# Patient Record
Sex: Female | Born: 1935 | Race: White | Hispanic: No | Smoking: Former smoker
Health system: Southern US, Community
[De-identification: ages and names within clinical notes are randomized; demographics above are authoritative.]

## PROBLEM LIST (undated history)

## (undated) DIAGNOSIS — I509 Heart failure, unspecified: Secondary | ICD-10-CM

## (undated) DIAGNOSIS — I499 Cardiac arrhythmia, unspecified: Secondary | ICD-10-CM

## (undated) DIAGNOSIS — I10 Essential (primary) hypertension: Secondary | ICD-10-CM

## (undated) DIAGNOSIS — K219 Gastro-esophageal reflux disease without esophagitis: Secondary | ICD-10-CM

## (undated) DIAGNOSIS — D649 Anemia, unspecified: Secondary | ICD-10-CM

## (undated) DIAGNOSIS — F0390 Unspecified dementia without behavioral disturbance: Secondary | ICD-10-CM

## (undated) DIAGNOSIS — IMO0001 Reserved for inherently not codable concepts without codable children: Secondary | ICD-10-CM

## (undated) DIAGNOSIS — Z5189 Encounter for other specified aftercare: Secondary | ICD-10-CM

## (undated) HISTORY — PX: APPENDECTOMY: SHX54

## (undated) HISTORY — DX: Cardiac arrhythmia, unspecified: I49.9

## (undated) HISTORY — PX: HIP FRACTURE SURGERY: SHX118

---

## 2000-10-05 ENCOUNTER — Ambulatory Visit (HOSPITAL_COMMUNITY): Admission: RE | Admit: 2000-10-05 | Discharge: 2000-10-05 | Payer: Self-pay | Admitting: Family Medicine

## 2000-10-05 ENCOUNTER — Encounter: Payer: Self-pay | Admitting: Family Medicine

## 2001-03-15 ENCOUNTER — Encounter: Admission: RE | Admit: 2001-03-15 | Discharge: 2001-03-15 | Payer: Self-pay | Admitting: Oncology

## 2001-03-15 ENCOUNTER — Encounter (HOSPITAL_COMMUNITY): Admission: RE | Admit: 2001-03-15 | Discharge: 2001-04-14 | Payer: Self-pay | Admitting: Oncology

## 2001-04-02 ENCOUNTER — Ambulatory Visit (HOSPITAL_COMMUNITY): Admission: RE | Admit: 2001-04-02 | Discharge: 2001-04-02 | Payer: Self-pay | Admitting: Oncology

## 2001-04-02 ENCOUNTER — Encounter (HOSPITAL_COMMUNITY): Payer: Self-pay | Admitting: Oncology

## 2001-09-16 ENCOUNTER — Encounter: Admission: RE | Admit: 2001-09-16 | Discharge: 2001-09-16 | Payer: Self-pay | Admitting: Oncology

## 2001-09-16 ENCOUNTER — Encounter (HOSPITAL_COMMUNITY): Admission: RE | Admit: 2001-09-16 | Discharge: 2001-10-16 | Payer: Self-pay | Admitting: Oncology

## 2001-10-07 ENCOUNTER — Ambulatory Visit (HOSPITAL_COMMUNITY): Admission: RE | Admit: 2001-10-07 | Discharge: 2001-10-07 | Payer: Self-pay | Admitting: Family Medicine

## 2001-10-07 ENCOUNTER — Encounter: Payer: Self-pay | Admitting: Family Medicine

## 2002-04-29 ENCOUNTER — Encounter (HOSPITAL_COMMUNITY): Admission: RE | Admit: 2002-04-29 | Discharge: 2002-05-29 | Payer: Self-pay | Admitting: Family Medicine

## 2002-05-19 ENCOUNTER — Encounter (HOSPITAL_COMMUNITY): Admission: RE | Admit: 2002-05-19 | Discharge: 2002-06-18 | Payer: Self-pay | Admitting: Oncology

## 2002-05-19 ENCOUNTER — Encounter: Admission: RE | Admit: 2002-05-19 | Discharge: 2002-05-19 | Payer: Self-pay | Admitting: Oncology

## 2002-10-10 ENCOUNTER — Ambulatory Visit (HOSPITAL_COMMUNITY): Admission: RE | Admit: 2002-10-10 | Discharge: 2002-10-10 | Payer: Self-pay | Admitting: Family Medicine

## 2002-10-10 ENCOUNTER — Encounter: Payer: Self-pay | Admitting: Family Medicine

## 2002-10-20 ENCOUNTER — Ambulatory Visit (HOSPITAL_COMMUNITY): Admission: RE | Admit: 2002-10-20 | Discharge: 2002-10-20 | Payer: Self-pay | Admitting: Family Medicine

## 2002-10-20 ENCOUNTER — Encounter: Payer: Self-pay | Admitting: Family Medicine

## 2002-11-26 ENCOUNTER — Encounter (HOSPITAL_COMMUNITY): Admission: RE | Admit: 2002-11-26 | Discharge: 2002-12-25 | Payer: Self-pay | Admitting: Oncology

## 2002-11-26 ENCOUNTER — Encounter: Admission: RE | Admit: 2002-11-26 | Discharge: 2002-11-26 | Payer: Self-pay | Admitting: Oncology

## 2003-01-02 ENCOUNTER — Ambulatory Visit (HOSPITAL_COMMUNITY): Admission: RE | Admit: 2003-01-02 | Discharge: 2003-01-02 | Payer: Self-pay | Admitting: Family Medicine

## 2003-01-02 ENCOUNTER — Encounter (HOSPITAL_COMMUNITY): Admission: RE | Admit: 2003-01-02 | Discharge: 2003-02-01 | Payer: Self-pay | Admitting: Family Medicine

## 2003-01-02 ENCOUNTER — Encounter: Payer: Self-pay | Admitting: Family Medicine

## 2003-01-27 ENCOUNTER — Ambulatory Visit (HOSPITAL_COMMUNITY): Admission: RE | Admit: 2003-01-27 | Discharge: 2003-01-27 | Payer: Self-pay | Admitting: *Deleted

## 2003-02-03 ENCOUNTER — Encounter (HOSPITAL_COMMUNITY): Admission: RE | Admit: 2003-02-03 | Discharge: 2003-03-05 | Payer: Self-pay | Admitting: Family Medicine

## 2003-05-27 ENCOUNTER — Encounter (HOSPITAL_COMMUNITY): Admission: RE | Admit: 2003-05-27 | Discharge: 2003-06-26 | Payer: Self-pay | Admitting: Oncology

## 2003-05-27 ENCOUNTER — Encounter: Admission: RE | Admit: 2003-05-27 | Discharge: 2003-05-27 | Payer: Self-pay | Admitting: Oncology

## 2003-10-22 ENCOUNTER — Ambulatory Visit (HOSPITAL_COMMUNITY): Admission: RE | Admit: 2003-10-22 | Discharge: 2003-10-22 | Payer: Self-pay | Admitting: Family Medicine

## 2003-12-08 ENCOUNTER — Ambulatory Visit (HOSPITAL_COMMUNITY): Admission: RE | Admit: 2003-12-08 | Discharge: 2003-12-08 | Payer: Self-pay | Admitting: Ophthalmology

## 2003-12-29 ENCOUNTER — Encounter: Admission: RE | Admit: 2003-12-29 | Discharge: 2003-12-29 | Payer: Self-pay | Admitting: Oncology

## 2004-01-05 ENCOUNTER — Ambulatory Visit (HOSPITAL_COMMUNITY): Admission: RE | Admit: 2004-01-05 | Discharge: 2004-01-05 | Payer: Self-pay | Admitting: Ophthalmology

## 2004-02-05 ENCOUNTER — Ambulatory Visit: Payer: Self-pay | Admitting: *Deleted

## 2004-02-23 ENCOUNTER — Ambulatory Visit (HOSPITAL_COMMUNITY): Admission: RE | Admit: 2004-02-23 | Discharge: 2004-02-23 | Payer: Self-pay | Admitting: Family Medicine

## 2004-03-09 ENCOUNTER — Ambulatory Visit: Payer: Self-pay | Admitting: *Deleted

## 2004-03-24 ENCOUNTER — Ambulatory Visit: Payer: Self-pay | Admitting: *Deleted

## 2004-04-22 ENCOUNTER — Ambulatory Visit: Payer: Self-pay | Admitting: *Deleted

## 2004-04-26 ENCOUNTER — Ambulatory Visit (HOSPITAL_COMMUNITY): Admission: RE | Admit: 2004-04-26 | Discharge: 2004-04-26 | Payer: Self-pay | Admitting: General Surgery

## 2004-05-06 ENCOUNTER — Ambulatory Visit: Payer: Self-pay | Admitting: *Deleted

## 2004-06-03 ENCOUNTER — Ambulatory Visit: Payer: Self-pay | Admitting: *Deleted

## 2004-07-05 ENCOUNTER — Ambulatory Visit: Payer: Self-pay | Admitting: *Deleted

## 2004-08-02 ENCOUNTER — Ambulatory Visit: Payer: Self-pay | Admitting: Cardiology

## 2004-09-05 ENCOUNTER — Ambulatory Visit: Payer: Self-pay | Admitting: *Deleted

## 2004-10-05 ENCOUNTER — Ambulatory Visit: Payer: Self-pay | Admitting: *Deleted

## 2004-10-24 ENCOUNTER — Ambulatory Visit (HOSPITAL_COMMUNITY): Admission: RE | Admit: 2004-10-24 | Discharge: 2004-10-24 | Payer: Self-pay | Admitting: Family Medicine

## 2004-11-03 ENCOUNTER — Ambulatory Visit: Payer: Self-pay | Admitting: *Deleted

## 2004-12-05 ENCOUNTER — Ambulatory Visit: Payer: Self-pay | Admitting: *Deleted

## 2004-12-27 ENCOUNTER — Ambulatory Visit (HOSPITAL_COMMUNITY): Payer: Self-pay | Admitting: Oncology

## 2004-12-27 ENCOUNTER — Encounter (HOSPITAL_COMMUNITY): Admission: RE | Admit: 2004-12-27 | Discharge: 2005-01-26 | Payer: Self-pay | Admitting: Oncology

## 2004-12-27 ENCOUNTER — Encounter: Admission: RE | Admit: 2004-12-27 | Discharge: 2004-12-27 | Payer: Self-pay | Admitting: Oncology

## 2005-01-13 ENCOUNTER — Ambulatory Visit: Payer: Self-pay | Admitting: *Deleted

## 2005-02-20 ENCOUNTER — Ambulatory Visit: Payer: Self-pay | Admitting: *Deleted

## 2005-03-02 ENCOUNTER — Ambulatory Visit (HOSPITAL_COMMUNITY): Admission: RE | Admit: 2005-03-02 | Discharge: 2005-03-02 | Payer: Self-pay | Admitting: Family Medicine

## 2005-03-16 ENCOUNTER — Ambulatory Visit: Payer: Self-pay | Admitting: Cardiology

## 2005-04-19 ENCOUNTER — Ambulatory Visit: Payer: Self-pay | Admitting: *Deleted

## 2005-05-23 ENCOUNTER — Ambulatory Visit: Payer: Self-pay | Admitting: *Deleted

## 2005-06-07 ENCOUNTER — Ambulatory Visit: Payer: Self-pay | Admitting: Cardiology

## 2005-07-12 ENCOUNTER — Ambulatory Visit: Payer: Self-pay | Admitting: *Deleted

## 2005-08-11 ENCOUNTER — Ambulatory Visit: Payer: Self-pay | Admitting: *Deleted

## 2005-09-12 ENCOUNTER — Ambulatory Visit: Payer: Self-pay | Admitting: *Deleted

## 2005-10-23 ENCOUNTER — Ambulatory Visit: Payer: Self-pay | Admitting: *Deleted

## 2005-10-25 ENCOUNTER — Ambulatory Visit (HOSPITAL_COMMUNITY): Admission: RE | Admit: 2005-10-25 | Discharge: 2005-10-25 | Payer: Self-pay | Admitting: Family Medicine

## 2005-11-22 ENCOUNTER — Ambulatory Visit: Payer: Self-pay | Admitting: *Deleted

## 2005-12-19 ENCOUNTER — Ambulatory Visit: Payer: Self-pay | Admitting: Cardiology

## 2006-01-10 ENCOUNTER — Encounter: Admission: RE | Admit: 2006-01-10 | Discharge: 2006-01-10 | Payer: Self-pay | Admitting: Oncology

## 2006-01-10 ENCOUNTER — Encounter (HOSPITAL_COMMUNITY): Admission: RE | Admit: 2006-01-10 | Discharge: 2006-02-09 | Payer: Self-pay | Admitting: Oncology

## 2006-01-10 ENCOUNTER — Ambulatory Visit (HOSPITAL_COMMUNITY): Payer: Self-pay | Admitting: Oncology

## 2006-01-16 ENCOUNTER — Ambulatory Visit: Payer: Self-pay | Admitting: Cardiology

## 2006-02-20 ENCOUNTER — Ambulatory Visit: Payer: Self-pay | Admitting: Cardiovascular Disease

## 2006-03-22 ENCOUNTER — Ambulatory Visit: Payer: Self-pay | Admitting: Cardiology

## 2006-03-30 ENCOUNTER — Inpatient Hospital Stay (HOSPITAL_COMMUNITY): Admission: AD | Admit: 2006-03-30 | Discharge: 2006-04-07 | Payer: Self-pay | Admitting: Family Medicine

## 2006-04-03 ENCOUNTER — Ambulatory Visit: Payer: Self-pay | Admitting: Internal Medicine

## 2006-04-03 ENCOUNTER — Encounter (INDEPENDENT_AMBULATORY_CARE_PROVIDER_SITE_OTHER): Payer: Self-pay | Admitting: Specialist

## 2006-10-29 ENCOUNTER — Ambulatory Visit (HOSPITAL_COMMUNITY): Admission: RE | Admit: 2006-10-29 | Discharge: 2006-10-29 | Payer: Self-pay | Admitting: Family Medicine

## 2007-01-16 ENCOUNTER — Encounter (HOSPITAL_COMMUNITY): Admission: RE | Admit: 2007-01-16 | Discharge: 2007-02-15 | Payer: Self-pay | Admitting: Oncology

## 2007-01-16 ENCOUNTER — Ambulatory Visit (HOSPITAL_COMMUNITY): Payer: Self-pay | Admitting: Oncology

## 2007-02-12 ENCOUNTER — Emergency Department (HOSPITAL_COMMUNITY): Admission: EM | Admit: 2007-02-12 | Discharge: 2007-02-12 | Payer: Self-pay | Admitting: Emergency Medicine

## 2007-10-30 ENCOUNTER — Ambulatory Visit (HOSPITAL_COMMUNITY): Admission: RE | Admit: 2007-10-30 | Discharge: 2007-10-30 | Payer: Self-pay | Admitting: Family Medicine

## 2008-01-22 ENCOUNTER — Ambulatory Visit: Payer: Self-pay | Admitting: Vascular Surgery

## 2008-01-31 ENCOUNTER — Encounter (HOSPITAL_COMMUNITY): Admission: RE | Admit: 2008-01-31 | Discharge: 2008-03-01 | Payer: Self-pay | Admitting: Oncology

## 2008-02-04 ENCOUNTER — Ambulatory Visit (HOSPITAL_COMMUNITY): Payer: Self-pay | Admitting: Oncology

## 2008-02-26 ENCOUNTER — Ambulatory Visit: Payer: Self-pay | Admitting: Vascular Surgery

## 2008-03-11 ENCOUNTER — Ambulatory Visit: Payer: Self-pay | Admitting: Vascular Surgery

## 2008-03-18 ENCOUNTER — Ambulatory Visit: Payer: Self-pay | Admitting: Vascular Surgery

## 2008-04-23 ENCOUNTER — Ambulatory Visit (HOSPITAL_COMMUNITY): Admission: RE | Admit: 2008-04-23 | Discharge: 2008-04-23 | Payer: Self-pay | Admitting: Family Medicine

## 2008-05-08 ENCOUNTER — Ambulatory Visit: Payer: Self-pay | Admitting: Vascular Surgery

## 2008-07-31 ENCOUNTER — Ambulatory Visit (HOSPITAL_COMMUNITY): Payer: Self-pay | Admitting: Oncology

## 2008-07-31 ENCOUNTER — Encounter (HOSPITAL_COMMUNITY): Admission: RE | Admit: 2008-07-31 | Discharge: 2008-08-30 | Payer: Self-pay | Admitting: Oncology

## 2008-08-04 ENCOUNTER — Ambulatory Visit (HOSPITAL_COMMUNITY): Payer: Self-pay | Admitting: Oncology

## 2008-10-20 ENCOUNTER — Ambulatory Visit (HOSPITAL_COMMUNITY): Admission: RE | Admit: 2008-10-20 | Discharge: 2008-10-20 | Payer: Self-pay | Admitting: Ophthalmology

## 2008-11-09 ENCOUNTER — Ambulatory Visit (HOSPITAL_COMMUNITY): Admission: RE | Admit: 2008-11-09 | Discharge: 2008-11-09 | Payer: Self-pay | Admitting: Family Medicine

## 2008-11-17 ENCOUNTER — Ambulatory Visit (HOSPITAL_COMMUNITY): Admission: RE | Admit: 2008-11-17 | Discharge: 2008-11-17 | Payer: Self-pay | Admitting: Ophthalmology

## 2009-02-02 ENCOUNTER — Ambulatory Visit (HOSPITAL_COMMUNITY): Payer: Self-pay | Admitting: Oncology

## 2009-02-02 ENCOUNTER — Encounter (HOSPITAL_COMMUNITY): Admission: RE | Admit: 2009-02-02 | Discharge: 2009-03-04 | Payer: Self-pay | Admitting: Oncology

## 2009-08-04 ENCOUNTER — Ambulatory Visit (HOSPITAL_COMMUNITY): Payer: Self-pay | Admitting: Oncology

## 2009-08-04 ENCOUNTER — Encounter (HOSPITAL_COMMUNITY): Admission: RE | Admit: 2009-08-04 | Discharge: 2009-09-03 | Payer: Self-pay | Admitting: Oncology

## 2009-11-18 ENCOUNTER — Ambulatory Visit (HOSPITAL_COMMUNITY): Admission: RE | Admit: 2009-11-18 | Discharge: 2009-11-18 | Payer: Self-pay | Admitting: Family Medicine

## 2010-02-15 ENCOUNTER — Ambulatory Visit (HOSPITAL_COMMUNITY): Payer: Self-pay | Admitting: Oncology

## 2010-02-15 ENCOUNTER — Encounter (HOSPITAL_COMMUNITY)
Admission: RE | Admit: 2010-02-15 | Discharge: 2010-03-17 | Payer: Self-pay | Source: Home / Self Care | Attending: Oncology | Admitting: Oncology

## 2010-04-17 ENCOUNTER — Encounter: Payer: Self-pay | Admitting: Family Medicine

## 2010-06-07 LAB — CBC
HCT: 38.3 % (ref 36.0–46.0)
Hemoglobin: 12.1 g/dL (ref 12.0–15.0)
MCH: 29.7 pg (ref 26.0–34.0)
MCHC: 31.6 g/dL (ref 30.0–36.0)
MCV: 94.1 fL (ref 78.0–100.0)
Platelets: 73 K/uL — ABNORMAL LOW (ref 150–400)
RBC: 4.07 MIL/uL (ref 3.87–5.11)
RDW: 16.1 % — ABNORMAL HIGH (ref 11.5–15.5)
WBC: 6.2 K/uL (ref 4.0–10.5)

## 2010-06-14 LAB — CBC
MCHC: 33.5 g/dL (ref 30.0–36.0)
RBC: 3.73 MIL/uL — ABNORMAL LOW (ref 3.87–5.11)

## 2010-06-16 ENCOUNTER — Ambulatory Visit (HOSPITAL_COMMUNITY)
Admission: RE | Admit: 2010-06-16 | Discharge: 2010-06-16 | Disposition: A | Discharge status: Home / Self Care | Payer: Medicare Other | Source: Ambulatory Visit | Attending: Family Medicine | Admitting: Family Medicine

## 2010-06-16 ENCOUNTER — Other Ambulatory Visit (HOSPITAL_COMMUNITY): Payer: Self-pay | Admitting: Family Medicine

## 2010-06-16 DIAGNOSIS — R0600 Dyspnea, unspecified: Secondary | ICD-10-CM

## 2010-06-16 DIAGNOSIS — R0609 Other forms of dyspnea: Secondary | ICD-10-CM | POA: Insufficient documentation

## 2010-06-16 DIAGNOSIS — R0989 Other specified symptoms and signs involving the circulatory and respiratory systems: Secondary | ICD-10-CM | POA: Insufficient documentation

## 2010-06-29 LAB — IRON AND TIBC
Saturation Ratios: 20 % (ref 20–55)
UIBC: 250 ug/dL

## 2010-06-29 LAB — FERRITIN: Ferritin: 162 ng/mL (ref 10–291)

## 2010-06-29 LAB — CBC: RBC: 3.82 MIL/uL — ABNORMAL LOW (ref 3.87–5.11)

## 2010-07-05 LAB — CBC
MCHC: 33.6 g/dL (ref 30.0–36.0)
WBC: 5 10*3/uL (ref 4.0–10.5)

## 2010-08-09 NOTE — Procedures (Signed)
DUPLEX DEEP VENOUS EXAM - LOWER EXTREMITY   INDICATION:  Followup right great saphenous vein ablation.   HISTORY:  Edema:  No.  Trauma/Surgery:  No.  Pain:  No.  PE:  No.  Previous DVT:  Right great saphenous thrombosis of the mid to distal  thigh noted on the exam performed at [facility] on 09/24/2007.  Anticoagulants:  Other:   DUPLEX EXAM:                CFV   SFV   PopV  PTV    GSV                R  L  R  L  R  L  R   L  R  L  Thrombosis    0  0  0     0     0      +  Spontaneous   +  +  +     +     +      0  Phasic        +  +  +     +     +      0  Augmentation  +  +  +     +     +      0  Compressible  +  +  +     +     +      0  Competent     0  0  0     0     +      0   Legend:  + - yes  o - no  p - partial  D - decreased    IMPRESSION:  1. Duplex shows no evidence of deep venous thrombosis.  2. The right great saphenous vein is ablated from proximal thigh to      mid distal thigh.  Reflux noted in deep venous system.        _____________________________  Larina Earthly, M.D.   AC/MEDQ  D:  03/18/2008  T:  03/18/2008  Job:  [id number]

## 2010-08-09 NOTE — Assessment & Plan Note (Signed)
OFFICE VISIT   Danielle Walker, Danielle Walker  DOB:  [birth year]                                       02/26/2008  JYNWG#:[id number]   The patient presents today for continued evaluation of venous  hypertension.  She is a very pleasant 77 year old white female with a  greater than 20 year history of venous ulceration.  She had seen Dr.  Darrick Penna for initial evaluation on 01/22/2008 and I have seen her for  further discussion for possible ablation of her superficial venous  hypertension.  She has had a many year history of bilateral venous  ulcers and has had treatment at [facility] and also at [facility] and at  [place].  She currently has an ulceration over her left anterior  lateral ankle and right posterior to her medial malleolus.  These are  quite shallow and are nearly healed.  She has had aggressive treatment  at the wound center at [facility] since May of this year.  Her daughter is  with her and reports that these have been larger in the past and are  making improvement.  I reviewed her venous Doppler study with the  patient and her daughter.  This does show superficial reflux and also  shows reflux in her superficial septum from the junction bilaterally and  also does show reflux in her bilateral femoral and popliteal segments.   On duplex imaging today with handheld Doppler.  She does have enlarged  right great saphenous vein from her saphenofemoral junction to mid thigh  with multiple tributary feeding vessels coming off of this.  On the left  side this is much less pronounced with a normal to small sized great  saphenous vein.  She does have some dilatation of this with collaterals  in the mid portion of her distal thigh and proximal calf.  I had a long  discussion with the patient and her daughter present.  I explained that  due to her deep venous reflux that this would be not possible to  completely treat her venous hypertension.  I did report that on the  right  side with her significantly enlarged saphenous vein I feel that we  would be able to make an impact on her venous hypertension with ablation  of her right great saphenous vein from her saphenofemoral junction to  mid thigh.  I explained that this is an outpatient procedure under local  anesthesia taking approximately 1 hour.  She understands this and wished  to proceed once we have assured insurance coverage with her.  I would  not recommend ablation of her left saphenous vein since this does not  appear to be as significant as the right side.  If she has dramatic  improvement on the right we would consider ablation of the mid thigh  portion of her left great saphenous vein which is the largest area with  reflux.  She will continue her local care at [facility] and we  will proceed with right leg ablation at her convenience.   Larina Earthly, M.D.  Electronically Signed   TFE/MEDQ  D:  02/26/2008  T:  02/27/2008  Job:  [id number]

## 2010-08-09 NOTE — Assessment & Plan Note (Signed)
OFFICE VISIT   VENDA, DICE V  DOB:  21-Nov-1923                                       05/08/2008  [id number]#:[id number]   Tana Trefry presents today 6 weeks following laser ablation of her  right great saphenous vein.  She had no complications related to this.  She does have a long history of bilateral venous hypertension and venous  ulcer disease.  She did have a left lateral ankle ulcer as well and this  is being treated with weekly Unna boots.  She has had a difficult time  wearing her compression garment on the right leg.  This continues to  roll down and cause pain for her.  She does have induration and  superficial excoriation over her right lateral ankle and medial ankle  between the malleolus and the Achilles tendon.  I re-imaged her right  saphenous vein and this is successfully closed.  She does have deep  venous reflux as well.  I discussed this at length with Ms. Cranmore and  her family present.  She does have reflux in her left great saphenous  vein.  I explained that she would be a candidate for left leg great  saphenous vein ablation, but I am not sure that this is warranted.  I  explained that with her deep reflux and recurrent ulceration on the  right leg, I am not sure that it would be enough of a contribution to  treat her superficial reflux since she is having ongoing problems with  venous hypertension on the right leg despite superficial correction.  We  have placed her in bilateral Unna boots today.  She will continue to  follow up with Dr. Mills Koller at the [facility].  I  feel that she will require chronic Unna boot treatment for chronic  venous stasis disease and, again, expressed the critical importance of  compression once her wounds are fully healed.  She will see [place] again on  an as-needed basis.   Larina Earthly, M.D.  Electronically Signed   TFE/MEDQ  D:  05/08/2008  T:  05/11/2008  Job:  [id number]   cc:    Jake Shark A. Tanda Rockers, M.D.

## 2010-08-09 NOTE — Assessment & Plan Note (Signed)
OFFICE VISIT   Danielle Walker, Danielle Walker  DOB:  1923/05/12                                       01/22/2008  EAVWU#:[id number]   The patient is an 77 year old female referred by Dr. Burman Freestone for chronic  leg ulcerations.  She is currently being followed in the wound care  clinic at [facility].   She has a lengthy history of ulcerations in both lower extremities.  These have been present for approximately 20 years off and on.  She has  had compression therapy in the past.  She has had multiple skin grafts  in the past.  The ulcerations usually occur at the same place on both  ankles.  Her daughter states that the wounds have never healed  completely or for any lengthy period of time in the last 20 years.  She  did have sclerotherapy of some varicose veins greater than 20 years ago.  This was primarily done for cosmetic reasons.   PAST MEDICAL HISTORY:  Her past medical history is fairly unremarkable.  She apparently has an irregular heart beat but her daughter did not know  whether or not she had atrial fibrillation.   MEDICATIONS:  1. Folate 1 mg once a day.  2. Evista 60 mg once a day.  3. Digoxin 0.125 mg once a day.  4. Ranitidine 150 mg twice a day.  5. Trental 400 mg four times a day.  6. Calcium 500 mg once a day.   ALLERGIES:  She is allergic to penicillin which causes a rash.   PAST SURGICAL HISTORY:  She had an appendectomy.   She denies history of diabetes.   FAMILY HISTORY:  Unremarkable.   SOCIAL HISTORY:  She is married.  She is a nonsmoker.  She quit 20 years  ago and only occasionally smoked prior to that.  She does not consume  alcohol regularly.   REVIEW OF SYSTEMS:  GI:  She has a hiatal hernia.  GU:  She has urinary frequency.  VASCULAR:  She denies history of TIA or stroke.  Neurologic, orthopedic, psychiatric, ENT, hematologic, pulmonary,  cardiac review of systems are otherwise negative.   PHYSICAL EXAMINATION:  Vital signs:  On  physical exam blood pressure is  160/85 in the left arm, pulse is 64 regular.  HEENT:  Unremarkable.  Neck:  Has 2+ carotid pulses without bruit.  Chest:  Clear to  auscultation.  Cardiac:  Slightly irregular without murmur.  Abdomen:  Is soft, nontender, nondistended with no masses.  Extremities:  She has  2+ femoral, popliteal and dorsalis pedis pulses bilaterally.  Posterior  tibial pulses are not palpable but she has very tight skin from scarring  from previous ulcerations in this area.  She has a 3 x 2 cm ulceration  on the right medial malleolus.  She has a 3.3 x 1 cm left lateral  malleolus ulcer.  She has no surrounding erythema.  She has no  significant edema in the lower extremities.   She had a venous duplex exam today which showed no evidence of DVT.  She  had chronic partially occlusive thrombus in the right greater saphenous  vein.  She had bilateral reflux at the saphenofemoral junction.  She  also had evidence of deep venous incompetence.  Previous ABIs performed  at [facility] on 09/26/2007 were 1.1 on the right and  1.29 on the left.   In summary, the patient has had a lengthy history of exacerbations and  remissions of lower extremity ulcers greater than 20 years.  I agree  with Dr. Burman Freestone that most likely these are venous in nature.  She does  have evidence of saphenofemoral junction reflux in both lower  extremities.  However, she also has evidence of deep venous  incompetence.  I believe she might have some benefit from laser ablation  of her greater saphenous vein for improvement of her lower extremity  venous symptoms.  However, I did counsel her daughter and the patient  today that since she has had a 20 year history of this that probably she  will continue to have some exacerbations and remissions and still would  require compression therapy long-term.  In light of her saphenous vein  reflux I have scheduled her for an appointment with Dr. Arbie Cookey, one of my   partners, to evaluate her for possible laser ablation of her saphenous  vein.  This is scheduled for early December.  She will continue to be  followed at the wound clinic for compression therapy and treatment of  her ulcerations in the meantime.   Janetta Hora. Fields, MD  Electronically Signed   CEF/MEDQ  D:  01/23/2008  T:  01/23/2008  Job:  [id number]   cc:   Kirk Ruths, M.D.  Dr Yolanda Bonine

## 2010-08-09 NOTE — Procedures (Signed)
DUPLEX DEEP VENOUS EXAM - LOWER EXTREMITY   INDICATION:  Nonhealing ulcers of bilateral lower extremities.   HISTORY:  Edema:  Bilateral.  Trauma/Surgery:  No.  Pain:  No.  PE:  No.  Previous DVT:  Right greater saphenous thrombosis of the mid-to-distal  thigh noted on the exam performed at [facility] on 09/24/2007.  Anticoagulants:  Other:   DUPLEX EXAM:                CFV   SFV   PopV  PTV    GSV                R  L  R  L  R  L  R   L  R  L  Thrombosis    o  o  o  o  o  o         +  0  Spontaneous   +  +  +  +  +  +         D  +  Phasic        +  +  +  +  +  +         D  +  Augmentation  +  +  +  +  +  +         D  +  Compressible  +  +  +  +  +  +         P  +  Competent     0  0  0  0  0  0         0   Legend:  + - yes  o - no  p - partial  D - decreased   IMPRESSION:  1. No evidence of deep venous thrombosis noted in the bilateral lower      extremities with chronic partially occlusive thrombus noted in the      right greater saphenous vein in the mid-to-distal thigh level.  2. Reflux is noted throughout the bilateral femoral-popliteal system      and at the bilateral saphenofemoral junction levels.  3. Unable to examine the bilateral calf veins due to bandaging.  4. Incidental finding of enlarged lymph nodes noted in the bilateral      groin regions.    _____________________________  Janetta Hora Fields, MD   CH/MEDQ  D:  01/22/2008  T:  01/22/2008  Job:  [id number]

## 2010-08-09 NOTE — Assessment & Plan Note (Signed)
OFFICE VISIT   ICIS, BUDREAU V  DOB:  1923/09/13                                       03/18/2008  [id number]#:[id number]   The patient presents today for 1 week followup right leg laser ablation  of her great saphenous vein with stab phlebectomy and tributary  varicosities.  She did well with the procedure and had minimal  discomfort associated with this.  She underwent venous duplex today in  our office which reveals closure of her saphenous vein throughout its  course from the knee to the saphenofemoral junction and no evidence of  deep vein thrombosis or injury.  I am quite pleased with her initial  result as is the patient.  She does have an ulceration on the left  pretibial area, which is being treated with Unna boot therapy weekly.  Her saphenous vein on the left is of smaller than normal size with mild  reflux and I feel that compression is the appropriate treatment.  She  will have an Unna boot placed on the left leg today and will continue to  follow up with the [facility].  She will see me again in 6  weeks for final followup regarding her right leg ablation.   Larina Earthly, M.D.  Electronically Signed   TFE/MEDQ  D:  03/18/2008  T:  03/19/2008  Job:  [id number]   cc:   [facility]

## 2010-08-12 NOTE — Procedures (Signed)
   NAME:  Danielle Walker, Danielle Walker                        ACCOUNT NO.:  [id number]   MEDICAL RECORD NO.:  [id number]                   PATIENT TYPE:  OUT   LOCATION:  RAD                                  FACILITY:  [facility]   PHYSICIAN:  Amity Bing, M.D.               DATE OF BIRTH:  16-Jul-1923   DATE OF PROCEDURE:  01/27/2003  DATE OF DISCHARGE:                                  ECHOCARDIOGRAM   CLINICAL DATA:  77 year-old woman with hypertension and atrial  fibrillation.   M-MODE:  Aorta 2.8, left atrium 5.0, septum 1.6, posterior wall 1.1, LV  diastole 3.9, LV systole 2.9.   1. Technically adequate echocardiographic study.  2. Mild left atrial enlargement; right atrial size at the upper limit of     normal.  Normal right ventricular size and function.  3. Normal mitral valve; mild annular calcification; trivial regurgitation.  4. Normal tricuspid and pulmonic valve; trivial tricuspid regurgitation;     mild elevation in estimated RV systolic pressure.  5. Mild aortic valvular sclerosis; mild annular calcification.  6. Normal internal dimension of the left ventricle; mild hypertrophy with     disproportion and involvement of the upper septum.  Normal regional and     global LV systolic function.  7. Normal IVC.      ___________________________________________                                            Conway Bing, M.D.   RR/MEDQ  D:  01/27/2003  T:  01/27/2003  Job:  [id number]

## 2010-08-12 NOTE — Assessment & Plan Note (Signed)
[facility]                         [place] [facility] OFFICE NOTE   NAME:Osso, LYZETTE REINHARDT                     MRN:          [id number]  DATE:11/22/2005                            DOB:          Sep 09, 1923    Ms Sidell is a lady we follow who has paroxysmal atrial fibrillation, on  Coumadin therapy.  She has done very well.  She also has mild hypertension  which is well controlled.   She is on the following medications:  1. Lanoxin 0.125 mg once a day.  2. Coumadin as directed by the [facility].  3. Evista 60 mg a day.  4. Zantac 150 mg a day.  5. Folic acid 1 mg a day.  6. Hydrochlorothiazide 12.5 mg once a day.   PHYSICAL EXAMINATION:  VITAL SIGNS:  She is 183 pounds, which is stable from  previous.  Her blood pressure is 130/70, pulse is 74.  CHEST:  Clear.  She is mildly kyphotic, so I do not hear any significant  sounds at the bases.  CARDIOVASCULAR:  Exam is somewhat distant but regular, no obvious murmur  noted.  EXTREMITIES:  Lower extremities she has some orthopedic challenges, but no  significant edema.   Overall Ms Pedraza is doing fine.  Her heart rate is reasonably well  controlled just on a simple regimen of Digoxin with the Coumadin, and I  think that in the grand scheme of things she is doing very well.  We will  see her back in 6 months for routine followup.                                   Farris Has. Dorethea Clan, MD   JMH/MedQ  DD:  11/22/2005  DT:  11/23/2005  Job #:  [id number]   cc:   Kirk Ruths, MD

## 2010-08-12 NOTE — Op Note (Signed)
NAME:  Danielle Walker, Danielle Walker              ACCOUNT NO.:  [id number]   MEDICAL RECORD NO.:  [id number]          PATIENT TYPE:  INP   LOCATION:  A212                          FACILITY:  [facility]   PHYSICIAN:  Lionel December, M.D.    DATE OF BIRTH:  [birth year]   DATE OF PROCEDURE:  04/03/2006  DATE OF DISCHARGE:                               OPERATIVE REPORT   PROCEDURE:  Colonoscopy followed by esophagogastroduodenoscopy.   INDICATION:  Danielle Walker is an 77 year old Caucasian female with history  of peripheral vascular disease and idiopathic thrombocytopenia (platelet  count greater than 100,000), who presents with acute GI bleed and  anemia.  She has received 2 units of PRBCs.  Her INR has corrected down  to 1.9 this morning.  Since then, she has received for another mg of  vitamin K. She is undergoing diagnostic colonoscopy followed by  esophagogastroduodenoscopy if indicated.  The procedure risks were  reviewed with the patient and informed consent was obtained.   MEDS FOR CONSCIOUS SEDATION:  Demerol 25 mg IV, Versed 3 mg IV in  divided dose.  Benzocaine spray for oropharyngeal topical anesthesia.   FINDINGS:  The procedure was performed in the endoscopy suite.  The  patient's vital signs and O2 sat were monitored during the procedure and  remained stable.   DESCRIPTION OF PROCEDURE:  1. Colonoscopy.  A rectal examination was performed.  No abnormality      was noted on external or digital exam.  The Pentax videoscope was      placed in the rectum and advanced under vision into the sigmoid      colon and beyond.  She had burgundy blood in dependent segments      with a few clots.  There were a few diverticula at the sigmoid      colon but none of these were bleeding or had clot.  The scope was      passed into cecum which was identified by the appendiceal stump and      ileocecal valve.  There was some burgundy blood in this area but,      once again, no bleeding site or AV malformations or  masses were      noted.  A short segment of the TI was also examined and there was      just a dilute coating of burgundy blood, once again, mucosa was      normal.  As the scope was withdrawn, the colonic mucosa was once      again carefully examined and no bleeding lesion was noted.  There      was a 4 mm polyp at the distal sigmoid colon which was ablated via      cold biopsy.  The rectal mucosa was normal.  The scope was      retroflexed to examine the anorectal junction which was      unremarkable.  The endoscope was straightened and the patient      prepared for procedure 2.   1. Esophagogastroduodenoscopy.  The Pentax videoscope was passed via      oropharynx  without any difficulty into esophagus.  Esophagus:  The mucosa of the esophagus was normal.  The GE junction was  at 40 cm from the incisors and was unremarkable.  Stomach.  It had some bile and left over GoLYTELY prep, but there was no  evidence of bleeding.  The stomach distended very well with  insufflation.  The folds of the proximal stomach were normal.  Examination of the mucosa at body, antrum, pyloric channel as well as  angularis, fundus and cardia was normal.  Duodenum:  The bulbar mucosa was normal.  The scope was passed into the  second and third part of the duodenum where mucosa and folds were  normal.  The endoscope was withdrawn.  The patient tolerated the  procedure well.   FINAL DIAGNOSIS:  1. [place] blood and few small clots noted scattered throughout the      colon but no bleeding lesion identified.  2. A few small diverticula at sigmoid colon.  3. 4 mL polyp ablated via cold biopsy from sigmoid colon.  4. Normal esophagogastroduodenoscopy.   RECOMMENDATIONS:  Will proceed with Given capsule study.  I reviewed the  procedure risks with the patient, the patient's husband, and their  daughter, informed consent was obtained.  She will be given single dose  of Reglan IV to facilitate passage of capsule  through upper GI tract.  She will be typed and crossmatched for 2 units.  She will get another  H&H and INR this afternoon.      Lionel December, M.D.  Electronically Signed     NR/MEDQ  D:  04/03/2006  T:  04/03/2006  Job:  [id number]

## 2010-08-12 NOTE — H&P (Signed)
NAME:  Danielle Walker, Danielle Walker              ACCOUNT NO.:  [id number]   MEDICAL RECORD NO.:  [id number]          PATIENT TYPE:  AMB   LOCATION:  A212                          FACILITY:  [facility]   PHYSICIAN:  Kirk Ruths, M.D.DATE OF BIRTH:  [birth year]   DATE OF ADMISSION:  DATE OF DISCHARGE:  LH                              HISTORY & PHYSICAL   CHIEF COMPLAINT:  Fatigue.   HISTORY OF PRESENT ILLNESS:  This 77 year old female was seen by Dr.  Malvin Johns on the day before admission feeling tired. Subsequent labs  showed a hemoglobin of 5. The patient was referred to me for admission.  She states she has been having bright red blood in her stools for  approximately a month. The patient denies abdominal pain or melena. She  is admitted for transfusion and GI evaluation.   ALLERGIES:  1. SULFA.  2. PENICILLIN.  3. VIBRAMYCIN.  4. TEQUIN.  5. CEFADROXIL.  6. MYCIN DRUGS.   PAST MEDICAL HISTORY:  1. She has a history of atrial fibrillation for which she takes 5 mg      of Coumadin a day.  2. The patient also has chronic ITP for which she sees Dr. __________      .  3. Peripheral vascular disease.  4. Chronic leg ulcerations followed by Dr. Malvin Johns.  5. Osteoporosis.  6. Status post appendectomy.  7. Status post right hip fracture.   MEDICATIONS:  1. Coumadin 5 mg a day.  2. Zantac 150 mg twice a day for a remote history of peptic ulcer      disease.  3. Evista 60 mg weekly.  4. Folic acid 1 mg daily.  5. Iron.  6. Lanoxin 125 mcg daily.  7. Calcium 500 mg twice a day.   REVIEW OF SYSTEMS:  Denies chest pain, nausea, vomiting or diarrhea. She  does admit to significant dyspnea on exertion of late and some mild  swelling in her lower extremities.   PHYSICAL EXAMINATION:  GENERAL: Elderly female who appears pale.  VITAL SIGNS: She is afebrile. Pulse 60 and irregular, blood pressure  110/60, respirations 20 and unlabored.  HEENT: TMs are normal. Pupils are equal to light and  accommodation.  Oropharynx is benign.  NECK: Supple without JVD, bruits or thyromegaly.  LUNGS: Clear.  HEART: Regular sinus rhythm without murmur, gallop or rub.  EXTREMITIES: Without clubbing or cyanosis. There is 1+ edema.  NEUROLOGIC: Grossly intact.   ASSESSMENT:  Anemia probably secondary to GI bleed: Check her pro-times.  Will transfuse and get a GI consult.      Kirk Ruths, M.D.  Electronically Signed     WMM/MEDQ  D:  03/30/2006  T:  03/30/2006  Job:  [id number]

## 2010-08-12 NOTE — Op Note (Signed)
NAME:  Danielle Walker, Danielle Walker              ACCOUNT NO.:  [id number]   MEDICAL RECORD NO.:  [id number]          PATIENT TYPE:  INP   LOCATION:  A332                          FACILITY:  [facility]   PHYSICIAN:  Lionel December, M.D.    DATE OF BIRTH:  [birth year]   DATE OF PROCEDURE:  04/03/2006  DATE OF DISCHARGE:  04/07/2006                               OPERATIVE REPORT   PROCEDURE:  Small bowel given capsule study.   INDICATION:  Danielle Walker is a 77 year old Caucasian female who presented  with acute GI bleed and anemia.  Her INR is corrected to almost normal.  She had EGD and colonoscopy earlier in the day.  No significant findings  noted on EGD but she had few diverticula sigmoid colon but dark blood  scattered throughout the colon without any obvious lesion.  She is  therefore undergoing this study.   The patient swallowed given capsule without any difficulty.   FINDINGS:  Capsule made it to the stomach in 1 minute and 46 seconds and  into duodenum at 1 hour and 10 minutes.   Study period was 8 hours but capsule was in distal small bowel but never  made it to the cecum.   There is a focal antral erythema without bleeding.  There was focal  erythema in distal small bowel seen at hour 6, 2 minutes and 43 seconds  but without stigmata of bleed.   FINAL DIAGNOSIS:  Given capsule study is incomplete as capsule did not  make it to the cecum.   Focal antral erythema and focal erythema noted at distal small bowel  without stigmata of bleeding.   These findings were reviewed with the patient and Dr. Regino Schultze on the  morning of 04/04/2006.   RECOMMENDATIONS:  Resume Coumadin 1-2 weeks.   Should she experience another episode of bleeding, should proceed with  GI bleeding scan and if it is negative, consider colonoscopy.      Lionel December, M.D.  Electronically Signed     NR/MEDQ  D:  04/08/2006  T:  04/09/2006  Job:  [id number]

## 2010-08-12 NOTE — Consult Note (Signed)
NAME:  Danielle Walker, Danielle Walker NO.:  [id number]   MEDICAL RECORD NO.:  [id number]          PATIENT TYPE:  INP   LOCATION:  A212                          FACILITY:  [facility]   PHYSICIAN:  Barbaraann Barthel, M.D. DATE OF BIRTH:  Dec 17, 1923   DATE OF CONSULTATION:  03/31/2006  DATE OF DISCHARGE:                                 CONSULTATION   This is an 77 year old white female well known to me in the past.  I  have been seeing her for chronic venous stasis ulcer disease on and off  for years.  We have had these healed and at various times and they  recur, likely some of which is due to her chronic anemia which is  related to her ITP.  Two days ago she had bilateral Unna boots placed,  these dressings are intact and I will not plan to change them while she  is in the hospital. I will continue making follow up arrangements with  her as an outpatient regarding her wound care.   She was seen in my office and referred to Dr. Regino Schultze when she looked  icteric and pale to me and we received counts showing that she had a  hemoglobin of approximately 5.7 and hematocrit of 17.  She was admitted  to the hospital where she is being seen by the medical service and she  is receiving now her fourth unit of packed red blood cells.  Her H&H was  6.6 and 20.2 with a platelet count of 108,000.  She appears stable and  responding to this.  We will follow her as needed. Her in hospital care  will be as per the medical and hematological service.      Barbaraann Barthel, M.D.  Electronically Signed     WB/MEDQ  D:  03/31/2006  T:  03/31/2006  Job:  [id number]   cc:   Kirk Ruths, M.D.  Fax: [phone]   Ladona Horns. Mariel Sleet, MD  Fax: [phone]

## 2010-08-12 NOTE — Discharge Summary (Signed)
NAME:  Danielle Walker, Danielle Walker              ACCOUNT NO.:  [id number]   MEDICAL RECORD NO.:  [id number]          PATIENT TYPE:  INP   LOCATION:  A332                          FACILITY:  [facility]   PHYSICIAN:  Kirk Ruths, M.D.DATE OF BIRTH:  [birth year]   DATE OF ADMISSION:  03/30/2006  DATE OF DISCHARGE:  01/12/2008LH                               DISCHARGE SUMMARY   DISCHARGE DIAGNOSES:  1. Anemia secondary to gastrointestinal bleed.  2. Prolonged INR secondary to Coumadin therapy.  3. Chronic stasis ulcer.  4. History of idiopathic thrombocytopenic purpura.  5. History of peripheral vascular disease.  6. History of chronic atrial fibrillation.  The patient on Coumadin      therapy.   HOSPITAL COURSE:  This elderly female had been seen by Dr. Malvin Johns the  day before for routine Unna boot changes for her chronic stasis ulcers.  At that time, Dr. Malvin Johns did some blood work which returned a  hemoglobin of 5.  He called me and I called her and promptly admitted  the patient with a GI bleed.  The patient stated she had been having  bright red blood per rectum and was seen by GI.  Obviously, her Coumadin  was withheld.  She was transfused with 2 units of packed cells promptly  with hemoglobin up to 8.6.  The patient underwent an EGD which was  normal.  Her colonoscopic exam showed some diverticula and a polyp.  No  obvious source of bleed, but it was a poor exam due to copious blood in  her rectum.  The patient continued to have bloody stools.  Hemoglobin  drifted back down to 8.3, and she was transfused again with hemoglobin  up to 10.7.  The patient underwent a capsule study which showed no  obvious source of bleed.  It was incomplete and did not pass to the  cecum.  The patient's initial INR was 3.1.  This was back to normal by  the time of her discharge at 1.2.  Electrolytes were within normal  range.  The patient was having normal stools with hemoglobin stabilized  at 9.6.  Her  Coumadin and aspirin will be held for 3 weeks.  She was  discharged home on Nu-Iron and MiraLax.  She will be followed by Dr.  Malvin Johns for her Roland Rack boot and GI for future studies.   DISCHARGE MEDICATIONS:  The patient also was discharged home on her:  1. Folic acid.  2. Digoxin.  3. Evista.  4. Zantac.  5. __________.   FOLLOWUP:  1. She will be followed by Dr. Malvin Johns for her Roland Rack boot.  2. She will be followed for GI for future studies.  3. She will be followed in my office in 2-3 weeks.      Kirk Ruths, M.D.  Electronically Signed     WMM/MEDQ  D:  05/01/2006  T:  05/01/2006  Job:  [id number]

## 2010-08-12 NOTE — Consult Note (Signed)
NAME:  Danielle Walker, Danielle Walker              ACCOUNT NO.:  [id number]   MEDICAL RECORD NO.:  [id number]          PATIENT TYPE:  INP   LOCATION:  A212                          FACILITY:  [facility]   PHYSICIAN:  Kassie Mends, M.D.      DATE OF BIRTH:  12-28-1923   DATE OF CONSULTATION:  03/30/2006  DATE OF DISCHARGE:                                 CONSULTATION   REASON FOR CONSULTATION:  Hematochezia.   HISTORY OF PRESENT ILLNESS:  Danielle Walker is an 77 year old female who has  been having bright red blood per rectum for the last month.  She has a  significant past medical history of paroxysmal atrial fibrillation and  is maintained on Coumadin.  She denies any difficulty swallowing,  nausea, vomiting, or abdominal pain.  She denies any heartburn or  indigestion.  She denies any weight loss.  Her appetite has been good.  She has never had a colonoscopy or an upper endoscopy.   PAST MEDICAL HISTORY:  1. Idiopathic thrombocytopenia.  2. Peripheral vascular disease.  3. Chronic leg ulcerations.   PAST SURGICAL HISTORY:  1. Appendectomy.  2. Right hip repair.   ALLERGIES:  SULFA, PENICILLIN, AZITHROMYCIN, TEQUIN, CEFADROXIL, MYCIN  DRUGS.   MEDICATIONS:  1. Pepcid.  2. Coumadin.   HOME MEDICATIONS:  1. Coumadin.  2. Zantac.  3. E-Vista.  4. Folic acid.  5. Iron.  6. Lanoxin.  7. Calcium.   FAMILY HISTORY:  She has no family history of colon cancer or colon  polyps.   SOCIAL HISTORY:  She denies any alcohol or tobacco use.  She denies any  use of aspirin, BCs, or Goody Powders.  She does not use ibuprofen,  Motrin, or Aleve.   REVIEW OF SYSTEMS:  As previously mentioned, otherwise, all systems are  negative.   PHYSICAL EXAMINATION:  VITAL SIGNS:  Temperature 97.8, blood pressure  106/55, pulse 65, respiratory rate 20.  GENERAL:  She is in no apparent distress, alert and oriented x4.  HEENT:  Normocephalic, atraumatic, pupils equal and reactive to light, mouth  with no oral  lesions.  Posterior pharynx without erythema or exudate.  NECK:  Full range of motion and no lymphadenopathy. LUNGS:  Clear to  auscultation bilaterally.  CARDIOVASCULAR EXAM:  Regular rhythm, normal  S1 and S2.  ABDOMEN:  Bowel sounds are present, soft, nontender,  nondistended, no rebound or guarding, no abdominal bruits.  EXTREMITIES:  Bilateral 1+ pitting edema with hyperpigmentation.  NEUROLOGICAL:  She has no focal neurological deficits.   LABORATORY DATA:  INR 3.9, hemoglobin 5.   ASSESSMENT:  Danielle Walker is an 77 year old female with hematochezia and a  microcytic anemia on Coumadin for A-fib.  She has no history of stroke  or acute MI and on physical exam, appears to be normal sinus rhythm.  Her lower GI bleed is likely secondary to a polyp and low likelihood of  malignancy.   Thank you for allowing me to see Danielle Walker in consultation.  My  recommendations to follow.   RECOMMENDATIONS:  1. Danielle Walker needs a colonoscopy followed by an EGD if no  source for      her anemia can be found.  Her INR will need to be less than 2 prior      to endoscopy being performed.  I can plan to do her procedure next      Monday or Tuesday if her INR is less than 2.  2. Would hold iron and Coumadin for now.  3. Agree with transfusion.  I discussed this plan with Danielle Walker and      her daughter.      Kassie Mends, M.D.  Electronically Signed     SM/MEDQ  D:  03/31/2006  T:  03/31/2006  Job:  [id number]

## 2010-09-07 ENCOUNTER — Ambulatory Visit: Payer: Medicare Other | Admitting: Adult Health

## 2010-09-19 ENCOUNTER — Other Ambulatory Visit: Payer: Self-pay | Admitting: Cardiology

## 2010-09-19 ENCOUNTER — Ambulatory Visit (INDEPENDENT_AMBULATORY_CARE_PROVIDER_SITE_OTHER): Payer: Medicare Other | Admitting: Cardiology

## 2010-09-19 ENCOUNTER — Encounter: Payer: Self-pay | Admitting: Cardiology

## 2010-09-19 VITALS — BP 191/79 | HR 64 | Ht 69.0 in | Wt 199.0 lb

## 2010-09-19 DIAGNOSIS — I4891 Unspecified atrial fibrillation: Secondary | ICD-10-CM

## 2010-09-19 DIAGNOSIS — I482 Chronic atrial fibrillation, unspecified: Secondary | ICD-10-CM

## 2010-09-19 DIAGNOSIS — R1907 Generalized intra-abdominal and pelvic swelling, mass and lump: Secondary | ICD-10-CM

## 2010-09-19 DIAGNOSIS — I5033 Acute on chronic diastolic (congestive) heart failure: Secondary | ICD-10-CM

## 2010-09-19 MED ORDER — FUROSEMIDE 20 MG PO TABS: 20.0000 mg | ORAL_TABLET | Freq: Two times a day (BID) | ORAL | Status: DC

## 2010-09-19 NOTE — Progress Notes (Signed)
HPI Danielle Walker comes in today for the ventilation of possible congestive heart failure. She is referred by Dr.McGough.  She has had a long history of chronic atrial fibrillation. She's been controlled with anticoagulation and rate control with Lanoxin. She was last evaluated by [place] in 2007.  She was taken off of Coumadin because of a GI bleed. This history is related by her daughter who is very attentive and very informed.  She's complained of abdominal swelling but no significant lower extremity edema. Her feet have chronic wound issues and they are both wrapped today. She is an active patient of the wound center with Dr. Tanda Rockers.  She still lives at home but has 24 7 coverage by her family.  She denies orthopnea PND. She's had no chest pain.  Echocardiogram today shows coarse H. Were filled versus atypical flutter with a well-controlled ventricular rate. No acute changes. Past Medical History  Diagnosis Date  . Arrhythmia     No past surgical history on file.  Family History  Problem Relation Age of Onset  . Heart disease Mother   . Heart disease Father     History   Social History  . Marital Status: Widowed    Spouse Name: N/A    Number of Children: N/A  . Years of Education: N/A   Occupational History  . Not on file.   Social History Main Topics  . Smoking status: Former Games developer  . Smokeless tobacco: Never Used  . Alcohol Use: No  . Drug Use: No  . Sexually Active: Not on file   Other Topics Concern  . Not on file   Social History Narrative  . No narrative on file    Allergies  Allergen Reactions  . Penicillins     Current Outpatient Prescriptions  Medication Sig Dispense Refill  . aspirin 81 MG tablet Take 81 mg by mouth daily.        . Calcium Carb-Cholecalciferol (CALCIUM PLUS VITAMIN D3) 600-500 MG-UNIT CAPS Take 1 capsule by mouth daily.        . digoxin (LANOXIN) 0.125 MG tablet Take 125 mcg by mouth daily.        Marland Kitchen donepezil (ARICEPT) 10 MG  tablet Take 10 mg by mouth at bedtime as needed.        . Ferrous Sulfate (IRON) 325 (65 FE) MG TABS Take 1 tablet by mouth daily.        . folic acid (FOLVITE) 1 MG tablet Take 1 mg by mouth daily.        . memantine (NAMENDA) 10 MG tablet Take 10 mg by mouth daily.        . raloxifene (EVISTA) 60 MG tablet Take 60 mg by mouth daily.        . ranitidine (ZANTAC) 150 MG tablet Take 150 mg by mouth 2 (two) times daily.        . furosemide (LASIX) 20 MG tablet Take 1 tablet (20 mg total) by mouth 2 (two) times daily.  60 tablet  3    ROS Negative other than HPI.   PE General Appearance: well developed, well nourished in no acute distress, obese, pleasantly demented HEENT: symmetrical face, PERRLA, Poor dentition  Neck: no JVD, thyromegaly, or adenopathy, trachea midline Chest: symmetric without deformity Cardiac: PMI non-displaced, iregular rate and rhythm normal S1, S2, no gallop or murmur Lung: clear to ausculation and percussion Vascular: all pulses full without bruits  Abdominal: nondistended, nontender, good bowel sounds, no HSM, no  bruits Extremities: no cyanosis, clubbing or edema, no sign of DVT, no varicosities  Skin: normal color, no rashes Neuro: alert and oriented x 3, non-focal Pysch: normal affect Filed Vitals:   09/19/10 1318  BP: 191/79  Pulse: 64  Height: 5\' 9"  (1.753 m)  Weight: 199 lb (90.266 kg)  SpO2: 93%    EKG  Labs and Studies Reviewed.   Lab Results  Component Value Date   WBC 6.2 02/15/2010   HGB 12.1 02/15/2010   HCT 38.3 02/15/2010   MCV 94.1 02/15/2010   PLT 73* 02/15/2010      Chemistry   No results found for this basename: NA, K, CL, CO2, BUN, CREATININE, GLU   No results found for this basename: CALCIUM, ALKPHOS, AST, ALT, BILITOT       No results found for this basename: CHOL   No results found for this basename: HDL   No results found for this basename: LDLCALC   No results found for this basename: TRIG   No results found  for this basename: CHOLHDL   No results found for this basename: HGBA1C   No results found for this basename: ALT, AST, GGT, ALKPHOS, BILITOT   No results found for this basename: TSH

## 2010-09-19 NOTE — Patient Instructions (Addendum)
Your physician recommends that you return for lab work in: today and in 2 weeks  Your physician has recommended you make the following change in your medication: start taking Lasix 20mg  every morning  Your physician recommends that you schedule a follow-up appointment in: 2 weeks

## 2010-09-20 LAB — BASIC METABOLIC PANEL
BUN: 11 mg/dL (ref 6–23)
CO2: 28 mEq/L (ref 19–32)
Calcium: 9.3 mg/dL (ref 8.4–10.5)
Sodium: 141 mEq/L (ref 135–145)

## 2010-09-23 ENCOUNTER — Encounter: Payer: Self-pay | Admitting: Cardiology

## 2010-09-30 ENCOUNTER — Encounter: Payer: Self-pay | Admitting: Cardiology

## 2010-09-30 ENCOUNTER — Ambulatory Visit (INDEPENDENT_AMBULATORY_CARE_PROVIDER_SITE_OTHER): Payer: Medicare Other | Admitting: Cardiology

## 2010-09-30 DIAGNOSIS — I5032 Chronic diastolic (congestive) heart failure: Secondary | ICD-10-CM | POA: Insufficient documentation

## 2010-09-30 DIAGNOSIS — I509 Heart failure, unspecified: Secondary | ICD-10-CM

## 2010-09-30 LAB — BASIC METABOLIC PANEL
CO2: 36 mEq/L — ABNORMAL HIGH (ref 19–32)
Calcium: 9.8 mg/dL (ref 8.4–10.5)
Creat: 0.69 mg/dL (ref 0.50–1.10)
Glucose, Bld: 97 mg/dL (ref 70–99)
Potassium: 3.5 mEq/L (ref 3.5–5.3)

## 2010-09-30 NOTE — Patient Instructions (Signed)
**Note De-Identified  Obfuscation** Your physician recommends that you continue on your current medications as directed. Please refer to the Current Medication list given to you today.  Your physician recommends that you return for lab work in: today  Your physician recommends that you schedule a follow-up appointment in: August

## 2010-09-30 NOTE — Progress Notes (Signed)
HPI Danielle Walker returns for close followup of her acute on chronic diastolic heart failure and generalized abdominal swelling. He  We added 20 mg of Lasix to her program. Her biggest complaint was abdominal swelling and some mild shortness of breath. Her lower extremities are wrapped from chronic edema and peripheral last her disease and they're difficult to assess.  She has lost 9 pounds since we started the low-dose Lasix. Her electrolytes were stable with a BUN of 11 creatinine of 0.6 prior to starting this. In addition her potassium was 3.9 her sodium was 141.  She feels a little less short of breath and her abdominal swelling has improved. She is fairly stoic as her family reinforced today. Her urine has gotten darker. Past Medical History  Diagnosis Date  . Arrhythmia     No past surgical history on file.  Family History  Problem Relation Age of Onset  . Heart disease Mother   . Heart disease Father     History   Social History  . Marital Status: Widowed    Spouse Name: N/A    Number of Children: N/A  . Years of Education: N/A   Occupational History  . Not on file.   Social History Main Topics  . Smoking status: Former Games developer  . Smokeless tobacco: Never Used  . Alcohol Use: No  . Drug Use: No  . Sexually Active: Not on file   Other Topics Concern  . Not on file   Social History Narrative  . No narrative on file    Allergies  Allergen Reactions  . Penicillins   . Sulfa Antibiotics Itching    Current Outpatient Prescriptions  Medication Sig Dispense Refill  . aspirin 81 MG tablet Take 81 mg by mouth daily.        . Calcium Carb-Cholecalciferol (CALCIUM PLUS VITAMIN D3) 600-500 MG-UNIT CAPS Take 1 capsule by mouth daily.        . digoxin (LANOXIN) 0.125 MG tablet Take 125 mcg by mouth daily.        Marland Kitchen donepezil (ARICEPT) 10 MG tablet Take 10 mg by mouth at bedtime as needed.        . Ferrous Sulfate (IRON) 325 (65 FE) MG TABS Take 1 tablet by mouth daily.         . folic acid (FOLVITE) 1 MG tablet Take 1 mg by mouth daily.        . furosemide (LASIX) 20 MG tablet Take 1 tablet (20 mg total) by mouth 2 (two) times daily.  60 tablet  3  . memantine (NAMENDA) 10 MG tablet Take 10 mg by mouth daily.        . raloxifene (EVISTA) 60 MG tablet Take 60 mg by mouth daily.        . ranitidine (ZANTAC) 150 MG tablet Take 150 mg by mouth 2 (two) times daily.          ROS Negative other than HPI.   PE General Appearance: well developed, well nourished in no acute distress, frail HEENT: symmetrical face, PERRLA, good dentition  Neck: no JVD, thyromegaly, or adenopathy, trachea midline Chest: symmetric without deformity Cardiac: PMI non-displaced, RRR, normal S1, S2, no gallop or murmur Lung: clear to ausculation and percussion Vascular: all pulses full without bruits  Abdominal: less distended, nontender, good bowel sounds, no HSM, no bruits Extremities: legs wrapped, no significant pitting edema  Skin: normal color, no rashes Neuro: alert and oriented x 3, non-focal Pysch: normal affect Filed  Vitals:   09/30/10 1023  BP: 177/79  Pulse: 63  Resp: 20  Height: 5\' 10"  (1.778 m)  SpO2: 90%    EKG  Labs and Studies Reviewed.   Lab Results  Component Value Date   WBC 6.2 02/15/2010   HGB 12.1 02/15/2010   HCT 38.3 02/15/2010   MCV 94.1 02/15/2010   PLT 73* 02/15/2010      Chemistry      Component Value Date/Time   NA 141 09/19/2010 1450   K 3.9 09/19/2010 1450   CL 102 09/19/2010 1450   CO2 28 09/19/2010 1450   BUN 11 09/19/2010 1450   CREATININE 0.60 09/19/2010 1450      Component Value Date/Time   CALCIUM 9.3 09/19/2010 1450       No results found for this basename: CHOL   No results found for this basename: HDL   No results found for this basename: LDLCALC   No results found for this basename: TRIG   No results found for this basename: CHOLHDL   No results found for this basename: HGBA1C   No results found for this  basename: ALT, AST, GGT, ALKPHOS, BILITOT   No results found for this basename: TSH

## 2010-10-03 ENCOUNTER — Telehealth: Payer: Self-pay

## 2010-10-03 MED ORDER — POTASSIUM CHLORIDE CRYS ER 20 MEQ PO TBCR: 20.0000 meq | EXTENDED_RELEASE_TABLET | Freq: Every day | ORAL | Status: DC

## 2010-10-03 NOTE — Telephone Encounter (Signed)
Message copied by Demetrios Loll on Mon Oct 03, 2010  4:44 PM ------      Message from: Valera Castle C      Created: Mon Oct 03, 2010  9:24 AM       Add 20 meq of KCL daily. No followup bloodwork necessary.

## 2010-10-10 ENCOUNTER — Other Ambulatory Visit: Payer: Self-pay

## 2010-10-10 ENCOUNTER — Telehealth: Payer: Self-pay | Admitting: Cardiology

## 2010-10-10 MED ORDER — POTASSIUM CHLORIDE CRYS ER 20 MEQ PO TBCR: 20.0000 meq | EXTENDED_RELEASE_TABLET | Freq: Every day | ORAL | Status: DC

## 2010-10-10 MED ORDER — FUROSEMIDE 20 MG PO TABS: 20.0000 mg | ORAL_TABLET | Freq: Two times a day (BID) | ORAL | Status: DC

## 2010-10-10 NOTE — Telephone Encounter (Signed)
Patient needs Lasix and Potassium changed from [facility] to mail order / [facility] / tg

## 2010-10-11 ENCOUNTER — Other Ambulatory Visit (HOSPITAL_COMMUNITY): Payer: Self-pay | Admitting: Family Medicine

## 2010-10-11 DIAGNOSIS — Z139 Encounter for screening, unspecified: Secondary | ICD-10-CM

## 2010-10-14 ENCOUNTER — Encounter (HOSPITAL_COMMUNITY): Payer: Self-pay

## 2010-10-14 ENCOUNTER — Other Ambulatory Visit: Payer: Self-pay

## 2010-10-14 ENCOUNTER — Emergency Department (HOSPITAL_COMMUNITY): Payer: Medicare Other

## 2010-10-14 ENCOUNTER — Inpatient Hospital Stay (HOSPITAL_COMMUNITY)
Admission: EM | Admit: 2010-10-14 | Discharge: 2010-10-18 | DRG: 309 | Disposition: A | Discharge status: Home Health | Payer: Medicare Other | Attending: Internal Medicine | Admitting: Internal Medicine

## 2010-10-14 DIAGNOSIS — I482 Chronic atrial fibrillation, unspecified: Secondary | ICD-10-CM | POA: Diagnosis present

## 2010-10-14 DIAGNOSIS — I4891 Unspecified atrial fibrillation: Principal | ICD-10-CM | POA: Diagnosis present

## 2010-10-14 DIAGNOSIS — R5381 Other malaise: Secondary | ICD-10-CM | POA: Diagnosis present

## 2010-10-14 DIAGNOSIS — I10 Essential (primary) hypertension: Secondary | ICD-10-CM | POA: Diagnosis present

## 2010-10-14 DIAGNOSIS — I5032 Chronic diastolic (congestive) heart failure: Secondary | ICD-10-CM

## 2010-10-14 DIAGNOSIS — D693 Immune thrombocytopenic purpura: Secondary | ICD-10-CM | POA: Diagnosis present

## 2010-10-14 DIAGNOSIS — F0390 Unspecified dementia without behavioral disturbance: Secondary | ICD-10-CM | POA: Diagnosis present

## 2010-10-14 DIAGNOSIS — I509 Heart failure, unspecified: Secondary | ICD-10-CM | POA: Diagnosis present

## 2010-10-14 DIAGNOSIS — R001 Bradycardia, unspecified: Secondary | ICD-10-CM

## 2010-10-14 DIAGNOSIS — R531 Weakness: Secondary | ICD-10-CM

## 2010-10-14 DIAGNOSIS — I4892 Unspecified atrial flutter: Secondary | ICD-10-CM | POA: Diagnosis present

## 2010-10-14 DIAGNOSIS — I498 Other specified cardiac arrhythmias: Secondary | ICD-10-CM | POA: Diagnosis present

## 2010-10-14 DIAGNOSIS — Z66 Do not resuscitate: Secondary | ICD-10-CM | POA: Diagnosis present

## 2010-10-14 HISTORY — DX: Heart failure, unspecified: I50.9

## 2010-10-14 HISTORY — DX: Anemia, unspecified: D64.9

## 2010-10-14 HISTORY — DX: Encounter for other specified aftercare: Z51.89

## 2010-10-14 HISTORY — DX: Unspecified dementia, unspecified severity, without behavioral disturbance, psychotic disturbance, mood disturbance, and anxiety: F03.90

## 2010-10-14 HISTORY — DX: Reserved for inherently not codable concepts without codable children: IMO0001

## 2010-10-14 HISTORY — DX: Gastro-esophageal reflux disease without esophagitis: K21.9

## 2010-10-14 HISTORY — DX: Essential (primary) hypertension: I10

## 2010-10-14 LAB — MAGNESIUM: Magnesium: 2.4 mg/dL (ref 1.5–2.5)

## 2010-10-14 LAB — URINALYSIS, ROUTINE W REFLEX MICROSCOPIC
Glucose, UA: NEGATIVE mg/dL
Leukocytes, UA: NEGATIVE
Specific Gravity, Urine: <1.005 — ABNORMAL LOW (ref 1.005–1.030)
pH: 5.5 (ref 5.0–8.0)

## 2010-10-14 LAB — URINE MICROSCOPIC-ADD ON

## 2010-10-14 LAB — COMPREHENSIVE METABOLIC PANEL
ALT: 5 U/L (ref 0–35)
Alkaline Phosphatase: 81 U/L (ref 39–117)
CO2: 35 mEq/L — ABNORMAL HIGH (ref 19–32)
Calcium: 9.6 mg/dL (ref 8.4–10.5)
GFR calc Af Amer: >60 mL/min (ref >60)
GFR calc non Af Amer: >60 mL/min (ref >60)
Glucose, Bld: 118 mg/dL — ABNORMAL HIGH (ref 70–99)
Potassium: 4.6 mEq/L (ref 3.5–5.1)
Sodium: 138 mEq/L (ref 135–145)
Total Bilirubin: 1.3 mg/dL — ABNORMAL HIGH (ref 0.3–1.2)

## 2010-10-14 LAB — CBC
Hemoglobin: 14.3 g/dL (ref 12.0–15.0)
MCH: 29.9 pg (ref 26.0–34.0)
RBC: 4.79 MIL/uL (ref 3.87–5.11)

## 2010-10-14 MED ORDER — POTASSIUM CHLORIDE CRYS ER 20 MEQ PO TBCR
20.0000 meq | EXTENDED_RELEASE_TABLET | Freq: Every day | ORAL | Status: DC
  Administered 2010-10-14 – 2010-10-18 (×5): 20 meq via ORAL
  Filled 2010-10-14 (×5): qty 1

## 2010-10-14 MED ORDER — RALOXIFENE HCL 60 MG PO TABS
60.0000 mg | ORAL_TABLET | Freq: Every day | ORAL | Status: DC
  Administered 2010-10-15 – 2010-10-18 (×4): 60 mg via ORAL
  Filled 2010-10-14 (×4): qty 1

## 2010-10-14 MED ORDER — SODIUM CHLORIDE 0.9 % IJ SOLN
3.0000 mL | Freq: Two times a day (BID) | INTRAMUSCULAR | Status: DC
  Administered 2010-10-14 – 2010-10-18 (×8): 3 mL via INTRAVENOUS
  Filled 2010-10-14 (×7): qty 3

## 2010-10-14 MED ORDER — DONEPEZIL HCL 5 MG PO TABS
10.0000 mg | ORAL_TABLET | Freq: Every day | ORAL | Status: DC
  Administered 2010-10-14: 10 mg via ORAL
  Filled 2010-10-14: qty 2

## 2010-10-14 MED ORDER — MEMANTINE HCL 10 MG PO TABS
10.0000 mg | ORAL_TABLET | Freq: Every day | ORAL | Status: DC
  Administered 2010-10-14 – 2010-10-18 (×5): 10 mg via ORAL
  Filled 2010-10-14 (×5): qty 1

## 2010-10-14 MED ORDER — FUROSEMIDE 20 MG PO TABS
20.0000 mg | ORAL_TABLET | Freq: Two times a day (BID) | ORAL | Status: DC
  Administered 2010-10-14 – 2010-10-18 (×8): 20 mg via ORAL
  Filled 2010-10-14 (×8): qty 1

## 2010-10-14 MED ORDER — SODIUM CHLORIDE 0.9 % IJ SOLN
INTRAMUSCULAR | Status: AC
  Administered 2010-10-14: 3 mL via INTRAVENOUS
  Filled 2010-10-14: qty 3

## 2010-10-14 MED ORDER — FAMOTIDINE 20 MG PO TABS
10.0000 mg | ORAL_TABLET | Freq: Every day | ORAL | Status: DC
  Administered 2010-10-14: 10 mg via ORAL
  Administered 2010-10-15: 20 mg via ORAL
  Administered 2010-10-16: 11:00:00 via ORAL
  Administered 2010-10-17 – 2010-10-18 (×2): 10 mg via ORAL
  Filled 2010-10-14 (×5): qty 1

## 2010-10-14 MED ORDER — SODIUM CHLORIDE 0.9 % IJ SOLN
3.0000 mL | INTRAMUSCULAR | Status: DC | PRN
  Filled 2010-10-14: qty 3

## 2010-10-14 MED ORDER — FOLIC ACID 1 MG PO TABS
1.0000 mg | ORAL_TABLET | Freq: Every day | ORAL | Status: DC
  Administered 2010-10-15 – 2010-10-18 (×4): 1 mg via ORAL
  Filled 2010-10-14 (×4): qty 1

## 2010-10-14 MED ORDER — HYDRALAZINE HCL 20 MG/ML IJ SOLN: 10.0000 mg | Freq: Four times a day (QID) | INTRAMUSCULAR | Status: DC | PRN

## 2010-10-14 MED ORDER — ASPIRIN 81 MG PO CHEW
81.0000 mg | CHEWABLE_TABLET | Freq: Every day | ORAL | Status: DC
  Administered 2010-10-14 – 2010-10-18 (×5): 81 mg via ORAL
  Filled 2010-10-14 (×5): qty 1

## 2010-10-14 MED ORDER — FERROUS SULFATE 325 (65 FE) MG PO TABS
325.0000 mg | ORAL_TABLET | Freq: Every day | ORAL | Status: DC
  Administered 2010-10-15 – 2010-10-18 (×4): 325 mg via ORAL
  Filled 2010-10-14 (×4): qty 1

## 2010-10-14 NOTE — ED Notes (Signed)
Daughter at bedside, alert, report called

## 2010-10-14 NOTE — ED Provider Notes (Signed)
History   Daughter states patient was recently diagnosed with CHF a few weeks ago and has been on lasix and has lost over 30 pounds. She is treated by Dr Daleen Squibb, [facility] cardiology. States she has been going to Dr Tyron Russell at the wound center and was just released. She had fallen on 7/8 and bruised her arm and foot that were xrayed by him and were okay. She has had a Left hip fx repair in 2003. Today she has been unable to stand because ? her left leg is giving out on her. Pt denies pain to me but has told her daughter it hurts. Has not fallen yesterday or today. Daughter denies cough, vomiting, diarrhea.   Chief Complaint  Patient presents with  . Extremity Weakness   Patient is a 77 y.o. female presenting with extremity weakness. The history is provided by a relative. History Limited By: dementia.  Extremity Weakness    Past Medical History  Diagnosis Date  . Arrhythmia   . CHF (congestive heart failure)   . Dementia   . Osteoporosis   . Hypertension   . GERD (gastroesophageal reflux disease)     Past Surgical History  Procedure Date  . Hip fracture surgery   . Appendectomy     Family History  Problem Relation Age of Onset  . Heart disease Mother   . Heart disease Father     History  Substance Use Topics  . Smoking status: Former Games developer  . Smokeless tobacco: Never Used  . Alcohol Use: No    OB History    Grav Para Term Preterm Abortions TAB SAB Ect Mult Living                  Review of Systems  Unable to perform ROS Musculoskeletal: Positive for extremity weakness.    Physical Exam  BP 170/64  Pulse 59  Temp(Src) 98.9 F (37.2 C) (Oral)  Resp 40  Ht 5\' 11"  (1.803 m)  Wt 183 lb (83.008 kg)  BMI 25.52 kg/m2  SpO2 90%  Physical Exam  Vitals reviewed. Constitutional: She appears well-developed and well-nourished.  Non-toxic appearance. She does not appear ill. No distress.       Pt has extreme bradycardia  HENT:  Head: Normocephalic and atraumatic.    Right Ear: External ear normal.  Left Ear: External ear normal.  Nose: Nose normal. No mucosal edema or rhinorrhea.  Mouth/Throat: Oropharynx is clear and moist and mucous membranes are normal. No dental abscesses or uvula swelling.       edentulous  Eyes: Conjunctivae and EOM are normal. Pupils are equal, round, and reactive to light.  Neck: Normal range of motion and full passive range of motion without pain. Neck supple.  Cardiovascular: Normal heart sounds.  An irregular rhythm present. Bradycardia present.  Exam reveals no gallop and no friction rub.   No murmur heard. Pulmonary/Chest: Effort normal and breath sounds normal. No respiratory distress. She has no wheezes. She has no rhonchi. She has no rales. She exhibits no tenderness and no crepitus.  Abdominal: Soft. Normal appearance and bowel sounds are normal. She exhibits no distension. There is no tenderness. There is no rebound and no guarding.  Musculoskeletal: Normal range of motion. She exhibits no edema and no tenderness.       Moves all extremities well.  Does not appear to have pain in her left hip and has no swelling, but daughter is concerned  Neurological: She is alert. She has normal strength.  No cranial nerve deficit.  Skin: Skin is warm, dry and intact. No rash noted. No erythema. No pallor.  Psychiatric: She has a normal mood and affect. Her speech is normal. Her mood appears not anxious. She is slowed. Cognition and memory are impaired. She exhibits abnormal recent memory.    ED Course  Procedures  MDM   Date: 10/14/2010  Rate: 38  Rhythm: atrial fibrillation  QRS Axis: normal  Intervals: normal  ST/T Wave abnormalities: nonspecific ST/T changes  Conduction Disutrbances:none  Narrative Interpretation:   Old EKG Reviewed: unchanged from 02/12/2007 except rate was 54  Dg Pelvis Portable  10/14/2010  *RADIOLOGY REPORT*  Clinical Data: Weakness, left hip/leg pain  PORTABLE PELVIS  Comparison: None.  Findings:  No evidence of acute fracture or dislocation.  Prior dynamic hip screw fixation of right proximal femur fracture.  Bony pelvis otherwise appears intact.  IMPRESSION: No evidence of acute fracture or dislocation.  Prior ORIF of right proximal femur fracture.  Original Report Authenticated By: Charline Bills, M.D.   Dg Chest Portable 1 View  10/14/2010  *RADIOLOGY REPORT*  Clinical Data: Weakness.  Left hip and leg pain secondary to a fall.  PORTABLE CHEST - 1 VIEW  Comparison: 06/16/2010  Findings: There is chronic marked cardiomegaly.  Pulmonary vascularity is within normal limits.  Lungs are clear.  No acute osseous abnormality.  IMPRESSION: Marked chronic cardiomegaly.  No acute abnormalities.  Original Report Authenticated By: Gwynn Burly, M.D.   Ward Givens, MD 10/15/10 [phone]

## 2010-10-14 NOTE — ED Notes (Signed)
Per pt family member pt is having left leg weakness. Per family member d/c from wound center in [place] recently. Per family wounds were to bilat legs. Pt denies feeling weak in legs. Pt to triage via w/c.

## 2010-10-14 NOTE — ED Notes (Signed)
Patient dried and bed changed with assistance from her daughter.

## 2010-10-14 NOTE — ED Notes (Signed)
Alert, Nad, color wnl, visitor at bedside.

## 2010-10-14 NOTE — H&P (Signed)
  Job [id number]

## 2010-10-14 NOTE — ED Notes (Signed)
Hospitalist here to see pt.

## 2010-10-15 ENCOUNTER — Other Ambulatory Visit: Payer: Self-pay

## 2010-10-15 DIAGNOSIS — R001 Bradycardia, unspecified: Secondary | ICD-10-CM | POA: Diagnosis present

## 2010-10-15 DIAGNOSIS — I4892 Unspecified atrial flutter: Secondary | ICD-10-CM | POA: Diagnosis present

## 2010-10-15 DIAGNOSIS — I10 Essential (primary) hypertension: Secondary | ICD-10-CM | POA: Diagnosis present

## 2010-10-15 LAB — CARDIAC PANEL(CRET KIN+CKTOT+MB+TROPI)
CK, MB: 1.7 ng/mL (ref 0.3–4.0)
CK, MB: 1.6 ng/mL (ref 0.3–4.0)
Relative Index: INVALID (ref 0.0–2.5)
Total CK: 45 U/L (ref 7–177)
Troponin I: <0.3 ng/mL (ref <0.30)

## 2010-10-15 LAB — CBC
HCT: 44.4 % (ref 36.0–46.0)
Hemoglobin: 13.8 g/dL (ref 12.0–15.0)
MCHC: 31.1 g/dL (ref 30.0–36.0)
WBC: 5.3 10*3/uL (ref 4.0–10.5)

## 2010-10-15 LAB — BASIC METABOLIC PANEL
BUN: 11 mg/dL (ref 6–23)
CO2: 34 mEq/L — ABNORMAL HIGH (ref 19–32)
Chloride: 99 mEq/L (ref 96–112)
Potassium: 3.9 mEq/L (ref 3.5–5.1)

## 2010-10-15 LAB — PRO B NATRIURETIC PEPTIDE: Pro B Natriuretic peptide (BNP): 2612 pg/mL — ABNORMAL HIGH (ref 0–450)

## 2010-10-15 LAB — PROTIME-INR
INR: 1.08 (ref 0.00–1.49)
Prothrombin Time: 14.2 seconds (ref 11.6–15.2)

## 2010-10-15 MED ORDER — LISINOPRIL 5 MG PO TABS
5.0000 mg | ORAL_TABLET | Freq: Every day | ORAL | Status: DC
  Administered 2010-10-15 – 2010-10-18 (×4): 5 mg via ORAL
  Filled 2010-10-15 (×4): qty 1

## 2010-10-15 NOTE — H&P (Signed)
NAMEBENTLEY, Danielle Walker              ACCOUNT NO.:  [id number]  MEDICAL RECORD NO.:  [id number]  LOCATION:  A303                          FACILITY:  [facility]  PHYSICIAN:  Tarry Kos, MD       DATE OF BIRTH:  [birth year]  DATE OF ADMISSION:  10/14/2010 DATE OF DISCHARGE:  LH                             HISTORY & PHYSICAL   CHIEF COMPLAINT:  Weakness.  HISTORY OF PRESENT ILLNESS:  [name] is an 77 year old pleasant female with a history of dementia, Afib who was on digoxin, chronic diastolic heart failure, who presents to the emergency department with family members after several days of progressive worsening weakness. She has not had any syncopal episodes.  She has not been eating very well.  She currently is being taken care of by 2 aide during the day and daughter stays with her at night.  She does ambulate at her baseline with assistance, but however, she has been having frequent falls.  She has not been feeling very well.  She denies any pain.  She does have mild dementia.  She denies any chest pain, abdominal pain.  She has not had any nausea, vomiting, diarrhea.  She has not had any focal neurological deficits.  She comes into the ED with heart rate initially around 40, currently is in the 50s and her blood pressure is elevated with systolics around 200.  We are being asked to admit the patient because of her weakness.  History is limited due to the patient's dementia, but according to her daughter who is a healthcare power of attorney [name], there has been no obvious reason for her weakness.  They do not have Home Health or  Physical Therapy arranged at home, they did with [place] and they were very satisfied with Genevieve Norlander in the past, but they do not anything currently.  She did not had any recent changes to her medicines.  REVIEW OF SYSTEMS:  Otherwise negative.  PAST MEDICAL HISTORY: 1. Chronic diastolic heart failure. 2. A flutter, currently not on  anticoagulation apparently due to fall     risk. 3. Chronic ITP. 4. Peripheral vascular disease. 5. Chronic leg ulcerations that have been improving over the last     week. 6. Osteoporosis. 7. Status post appendectomy. 8. Status post right hip fracture.  MEDICATIONS AT HOME:  She is on 1. Aspirin 81 mg a day. 2. Calcium carbonate 600 or 500 one tablet daily. 3. Digoxin 0.125 mg daily. 4. Aricept 10 mg nightly. 5. Iron sulfate 325 mg daily. 6. Folic acid 1 mg daily. 7. Namenda 10 mg daily. 8. Evista 60 mg daily. 9. Zantac 150 mg 2 times a day. 10.Lasix 20 mg take 2 daily. 11.KCl 20 mEq daily. 12.She was previously on Coumadin, but is currently not on Coumadin     due to unclear reasons, possibly due to frequent falls.  However,     there is some documentation that she was taken off of it because of     her previous GI bleed.  ALLERGIES:  PENICILLIN and SULFA causes a rash.  SOCIAL HISTORY:  She is a nonsmoker.  No alcohol.  No IV drug abuse. Again,  she is taken care of 24 x 7 by family and aides at home.  She is DNR, do not resuscitate this is confirmed with the healthcare power of attorney her daughter [name].  They are not interested in pursuing a pacemaker placement if needed.  PHYSICAL EXAMINATION:  VITALS:  Temperature is 98.9, her pulse on her EKG was initially 38.  Her heart rate is now in the 50s-60s range, respirations are documented as 40 but as I am seeing the patient her respiratory rate is normal.  Blood pressure 202/72, 96% O2 sats on room air. GENERAL:  She is alert and in no apparent distress.  She is only oriented to person, not to place or time.  Breathing normally with no increased work of breathing. HEENT:  Extraocular movements are intact.  Pupils are equal and reactive to light.  Oropharynx clear.  Mucous membranes are moist. NECK:  No JVD.  No carotid bruits. HEART:  Bradycardic and irregular, without any murmurs, rubs or gallops. CHEST:   Clear to auscultation bilaterally.  No wheeze, rhonchi, or rales. ABDOMEN:  Soft, nontender, nondistended.  Positive bowel sounds.  No hepatosplenomegaly. EXTREMITIES:  No clubbing, cyanosis or edema. PSYCH:  Normal mood and affect.  No agitation. NEURO:  No focal neurologic deficits.  Cranial nerves II-XII grossly intact without any abnormalities. SKIN:  No rashes.  LABORATORY DATA:  Her white count is normal.  Hemoglobin is normal at 14.3.  Electrolytes are normal.  Her platelets are low at 72.  Chest x- ray is negative for any infiltrate or heart failure.  Cardiac enzymes are negative.  Her dig level is 1. Her LFTs are normal.  Urinalysis is unrevealing.  CT of her head, pending.  A 12-lead EKG shows a flutter with a rate of 38 with no obvious acute changes.  ASSESSMENT/PLAN:  This is an 77 year old female who presented with generalized weakness that most obviously is probably due to her atrial flutter with slow ventricular response. 1. Atrial flutter with SVR.  I am going to hold her digoxin right now     and see if her rate improves with that.  I am also going to check a     2-D echo and serial cardiac enzymes and a BNP.  She clinically is     not in a heart failure right now.  We will continue her home dose     of Lasix and check thyroid studies. 2. Hypertension, uncontrolled.  We will place her on hydralazine and     as needed dose.  She likely would might benefit from long-acting     nitrates.  I would not put her on a beta-blocker due to her current     bradycardia, but this will need to be reassessed in the next 24     hours. 3. Chronic diastolic heart failure.  This is stable at this time.     Continue her home Lasix. 4. Dementia.  This is also stable. 5. Debilitated state.  We will obtain a physical therapy evaluation     and home health evaluation.  The family is wanting to continue to     take care of her at home as long as possible and depending on how     she does  with this hospitalization she may needs rehab. 6. The patient is DNR.  This has been confirmed with family members. 7. There is no signs of infection at this time.  Urinalysis is  negative along with her chest x-ray.  Follow up on CT of her head. 8. Further recommendation depending on overall hospital course.                                           ______________________________ Tarry Kos, MD     RD/MEDQ  D:  10/14/2010  T:  10/15/2010  Job:  [id number]

## 2010-10-15 NOTE — H&P (Signed)
Subjective:  77 year old female admitted for her legs giving out, she's had multiple falls in the past. Telemetry strip her heart rate was running in the 40s and 20s. Patient remains asymptomatic from this. But had a pulse greater than 3 seconds. This time the patient relates feeling asymptomatic. Relates that her legs feel better.  Objective: Vital signs in last 24 hours: Filed Vitals:   10/14/10 2102 10/15/10 0627 10/15/10 0700 10/15/10 0850  BP: 182/81 188/70    Pulse: 52 48  60  Temp: 97.8 F (36.6 C) 97.8 F (36.6 C)    TempSrc:      Resp: 22 20  18   Height:   5\' 4"  (1.626 m)   Weight:   86.5 kg (190 lb 11.2 oz)   SpO2: 92% 90%  93%    Intake/Output Summary (Last 24 hours) at 10/15/10 0953 Last data filed at 10/15/10 0600  Gross per 24 hour  Intake    483 ml  Output   1075 ml  Net   -592 ml   Weight change:   General appearance: alert, cooperative and no distress Resp: clear to auscultation bilaterally Chest wall: no tenderness Cardio: regular rate and rhythm, S1, S2 normal, no murmur, click, rub or gallop GI: soft, non-tender; bowel sounds normal; no masses,  no organomegaly Neuro exam: nonfocal.  Lab Results:  [facility] 10/15/10 0610 10/14/10 1516  NA 138 138  K 3.9 4.6  CL 99 98  CO2 34* 35*  GLUCOSE 98 118*  BUN 11 15  CREATININE <0.47* 0.68  CALCIUM 9.4 9.6    Basename 10/15/10 0610 10/14/10 1516  WBC 5.3 5.5  HGB 13.8 14.3  HCT 44.4 45.9  PLT 78* 72*    Studies/Results: Ct Head Wo Contrast  10/14/2010  *RADIOLOGY REPORT*  Clinical Data: Left lower extremity weakness, altered mental status  CT HEAD WITHOUT CONTRAST  Technique:    IMPRESSION: No acute intracranial findings.  No change from prior.  Original Report Authenticated By: Genevive Bi, M.D.   Dg Pelvis Portable  10/14/2010  *RADIOLOGY REPORT*  Clinical Data: Weakness, left hip/leg pain  PORTABLE PELVIS  Comparison: None.  Findings: No evidence of acute fracture or dislocation.  Prior  dynamic hip screw fixation of right proximal femur fracture.  Bony pelvis otherwise appears intact.  IMPRESSION: No evidence of acute fracture or dislocation.  Prior ORIF of right proximal femur fracture.  Original Report Authenticated By: Charline Bills, M.D.   Dg Chest Portable 1 View  10/14/2010  *RADIOLOGY REPORT*  Clinical Data: Weakness.  Left hip and leg pain secondary to a fall.  PORTABLE CHEST - 1 VIEW  Comparison: 06/16/2010  Findings: There is chronic marked cardiomegaly.  Pulmonary vascularity is within normal limits.  Lungs are clear.  No acute osseous abnormality.  IMPRESSION: Marked chronic cardiomegaly.  No acute abnormalities.  Original Report Authenticated By: Gwynn Burly, M.D.    Medications:  Scheduled Meds:   . aspirin  81 mg Oral Daily  . donepezil  10 mg Oral QHS  . famotidine  10 mg Oral Daily  . ferrous sulfate  325 mg Oral Daily  . folic acid  1 mg Oral Daily  . furosemide  20 mg Oral BID  . memantine  10 mg Oral Daily  . potassium chloride SA  20 mEq Oral Daily  . raloxifene  60 mg Oral Daily  . sodium chloride  3 mL Intravenous Q12H   Continuous Infusions:  PRN Meds:.hydrALAZINE, sodium chloride  Assessment/Plan: 1.  Atrial flutter and bradycardia, she is  bradycardic on a long pause period of heart rates between 20s and 40s. One of her0 pauses on telemetry a greater than 3 seconds. I will stop her digoxin. Also stopped her digoxin. Will consult cardiology and continue to monitor her heart rate. Cardiac Markers are negative.  2-D echo pending. 2. Diastolic CHF, chronic, stable continue her meds from home. 3. HTN (hypertension), benign currently high. What we'll add low-dose ACE. This could be a reflex from her low heart rate. At her closely 4. Bilateral leg weakness, no rashes ulcerations. Nonfocal and physical exam. We'll get physical therapy to evaluate. The patient does not want placement.  LOS: 1 day   Danielle Walker, Danielle Walker 10/15/2010, 9:53 AM

## 2010-10-16 NOTE — Progress Notes (Signed)
Subjective: 77 year old female with past medical history of generalized weakness mostly in her legs. Also past medical history of frequent falls of unknown cause. Admitted to the hospital and flutter A. fib on digoxin and a possible which was thought. Continues to have heart rates in the 30s. In greater than 3.5 second pauses on telemetry.  Objective: Vital signs in last 24 hours: Filed Vitals:   10/15/10 1952 10/15/10 2201 10/16/10 0600 10/16/10 0620  BP:  154/63  155/68  Pulse: 61 53  38  Temp:  98.4 F (36.9 C)  97.7 F (36.5 C)  TempSrc:      Resp: 24 20  16   Height:      Weight:   86.6 kg (190 lb 14.7 oz)   SpO2: 94% 91%  91%    Intake/Output Summary (Last 24 hours) at 10/16/10 0824 Last data filed at 10/16/10 0600  Gross per 24 hour  Intake    440 ml  Output    600 ml  Net   -160 ml   Weight change: 3.492 kg (7 lb 11.2 oz)  General appearance: alert, cooperative and no distress Resp: clear to auscultation bilaterally Chest wall: no tenderness Cardio: regular rate and rhythm, S1, S2 normal, no murmur, click, rub or gallop  Lab Results:  Basename 10/15/10 0610 10/14/10 1516  NA 138 138  K 3.9 4.6  CL 99 98  CO2 34* 35*  GLUCOSE 98 118*  BUN 11 15  CREATININE <0.47* 0.68  CALCIUM 9.4 9.6    Basename 10/15/10 0610 10/14/10 1516  WBC 5.3 5.5  HGB 13.8 14.3  HCT 44.4 45.9  PLT 78* 72*    Studies/Results:  Medications:  Scheduled Meds:   . aspirin  81 mg Oral Daily  . famotidine  10 mg Oral Daily  . ferrous sulfate  325 mg Oral Daily  . folic acid  1 mg Oral Daily  . furosemide  20 mg Oral BID  . lisinopril  5 mg Oral Daily  . memantine  10 mg Oral Daily  . potassium chloride SA  20 mEq Oral Daily  . raloxifene  60 mg Oral Daily  . sodium chloride  3 mL Intravenous Q12H  . DISCONTD: donepezil  10 mg Oral QHS   Continuous Infusions:  PRN Meds:.hydrALAZINE, sodium chloride  Assessment/Plan: 1. Atrial flutter & Bradycardia, on telemetry with more  than 3.5 second pauses. Heart rates into the 30s. On unexplained causes of falls. Will continue to monitor on telemetry. I have stopped the possibility and digoxin. He continues to have heart rate in the 30s with long pauses. Consult cardiology. 2. Diastolic CHF, chronic stable 3. HTN (hypertension), benign improved. 4. bilateral leg weakness physical therapy consult pending.  LOS: 2 days   [name], Brailyn Killion 10/16/2010, 8:24 AM

## 2010-10-17 DIAGNOSIS — R5383 Other fatigue: Secondary | ICD-10-CM

## 2010-10-17 DIAGNOSIS — I4891 Unspecified atrial fibrillation: Secondary | ICD-10-CM

## 2010-10-17 DIAGNOSIS — R5381 Other malaise: Secondary | ICD-10-CM

## 2010-10-17 DIAGNOSIS — I369 Nonrheumatic tricuspid valve disorder, unspecified: Secondary | ICD-10-CM

## 2010-10-17 NOTE — Consult Note (Signed)
CARDIOLOGY CONSULT NOTE  Patient ID: Danielle Walker MRN: [id number] DOB/AGE: [id number] 75 y.o.  Admit date: 10/14/2010 Referring Physician [facility] Primary Physician [place] Primary Cardiologist Juanito Doom Reason for Consultation: Bradycardia, pauses, slow afib  HPI: Danielle Walker is a pleasant 77 y/o female patient of Dr. Daleen Squibb we are asked to see by [facility] for bradycardia, slow afib with pauses up to 3 seconds  She has a history of  Diastolic CHF, Afib (not a coumadin candidate secondary to GIB) Chronic ITP and dementia.  She was admitted with increasing fatigue, FTT, and anorexia.  She was found to be in slow afib on arrival with HR in the 30's.  Digoxin was held. She continues bradycardic with HR in the 40's with frequent pauses 2-3 seconds despite holding the dig. She is on no other medications affecting HR.   Her daughter states that she is sedentary and is very unsteady on her feet. She is also [facility] [place] for chronic LE ulcers that are currently healed.  We are asked for recommendations as to need for pacemaker placement.  She denies chest pain, or shortness of breath. She has had some complaints of transient dizziness.   Review of systems complete and found to be negative unless listed above  Past Medical History  Diagnosis Date  . Arrhythmia   . CHF (congestive heart failure)   . Dementia   . Osteoporosis   . Hypertension   . GERD (gastroesophageal reflux disease)   . Anemia   . Blood transfusion     Family History  Problem Relation Age of Onset  . Heart disease Mother   . Heart disease Father     History   Social History  . Marital Status: Widowed    Spouse Name: N/A    Number of Children: N/A  . Years of Education: N/A   Occupational History  . Not on file.   Social History Main Topics  . Smoking status: Former Games developer  . Smokeless tobacco: Never Used  . Alcohol Use: No  . Drug Use: No  . Sexually Active: No   Other Topics Concern    . Not on file   Social History Narrative  . No narrative on file    Past Surgical History  Procedure Date  . Hip fracture surgery   . Appendectomy      Physical Exam: Blood pressure 124/59, pulse 46, temperature 98.1 F (36.7 C), temperature source Oral, resp. rate 18, height 5\' 4"  (1.626 m), weight 191 lb 9.3 oz (86.9 kg), SpO2 95.00%.  General:  Obese, deconditioned. No acute distress Head: Eyes PERRLA, No xanthomas.   Normal cephalic and atramatic  Lungs: Clear bilaterally to auscultation and percussion. Heart: Bradycardic, irregular with 1/6 systolic murmur.  Pulses are 2+ & equal., unable to palpate lower extremities secondary to compression wraps in place.            No carotid bruit. No JVD.  No abdominal bruits. No femoral bruits. Abdomen: Bowel sounds are positive, abdomen soft and non-tender without masses or  Hernia's noted. Msk:   Significant kyphosis noted.  Generalized weakness. Extremities: No clubbing, cyanosis or edema.  Bilateral lower extremities are wrapped in compression bandages. Neuro: Alert and currently responds appropriately, but has poor memory. Psych:  Good affect, responds appropriately  Labs:   Lab Results  Component Value Date   WBC 5.3 10/15/2010   HGB 13.8 10/15/2010   HCT 44.4 10/15/2010   MCV 96.1  10/15/2010   PLT 78* 10/15/2010    Lab 10/15/10 0610 10/14/10 1516  NA 138 --  K 3.9 --  CL 99 --  CO2 34* --  BUN 11 --  CREATININE <0.47* --  CALCIUM 9.4 --  PROT -- 8.1  BILITOT -- 1.3*  ALKPHOS -- 81  ALT -- 5  AST -- 21  GLUCOSE 98 --   Lab Results  Component Value Date   CKTOTAL 45 10/15/2010   TROPONINI <0.30 10/15/2010   TSH            1.183   BNP (10/15/10) 2612.0      Radiology: CXR:  IMPRESSION: Marked chronic cardiomegaly. No acute abnormalities. CT Head: IMPRESSION: No acute intracranial findings. No change from prior.  EKG: Afib with slow ventricular rate 52 bpm. ST depression inferiorly. Telemetry:  Course afib,  pauses up to 2.9 seconds rates as low as 30 bpm, avg 40-42 bpm.  ASSESSMENT AND PLAN:   1. Afib with slow ventricular response:  Agree with holding digoxin.  Her level was subtherapeutic on evaluation at 0.6.  HR remains in the 30's and 40's.  TSH was WNL.  She is a DNR per her daughter.  I am not certain a pacemaker is the best course of action as she has multiple co-morbidities and placement is unlikely to improve her quality of life.  She is also, sedentary, incontinent, and has dementia, although not advanced. Her daughter is realistic and only wishes to have pacemaker placed if this is helpful to her.   This will be discussed further with Dr. Dietrich Pates. Please see his note for further recommendations.  2. Chronic Diastolic CHF: There is no clear evidence of fluid overload on this assessment.  Continue diuresis with p.o. Lasix.  Monitor lytes.  More recommendations per Dr. Dietrich Pates per hospital course.  Signed: Bettey Mare. Lawrence NP 10/17/2010, 5:00 PM Co-Sign MD  Pt interviewed and examined.  Discussed with daughter.  She recently was seen by Dr. Daleen Squibb for weight gain and edema.  This has improved with diuresis.  CHF can be a manifestation of severe bradycardia, but that is not clearly the case for Danielle Walker.  Moreover, she has responded to diuretic and is now comfortable and not clearly symptomatic as the result of her bradycardia.  CV demands are modest--she does some walking with a walker.  In the absence of a clearer indication for pacing, I would not proceed.  There may yet be additional digoxin to be excreted and rate may increase further.  It would be worthwhile to walk her while on telemetry to determine if heart rate increases.  [place] Bing, MD

## 2010-10-17 NOTE — Progress Notes (Signed)
Subjective: 77 year old female with past medical history of generalized weakness mostly in her legs. Also past medical history of frequent falls of unknown cause. Admitted to the hospital and flutter A. fib on digoxin and a possible which was thought. No further episodes of pauses greater than 3 seconds. There continues to be bradycardic and a flutter. No complaints from the patient's part  Objective: Vital signs in last 24 hours: Filed Vitals:   10/16/10 2052 10/16/10 2120 10/17/10 0542 10/17/10 0825  BP:  154/68 134/64   Pulse:  57 40 49  Temp:  97.8 F (36.6 C) 98.1 F (36.7 C)   TempSrc:      Resp:  20 16   Height:      Weight:   86.9 kg (191 lb 9.3 oz)   SpO2: 94% 90% 97% 95%    Intake/Output Summary (Last 24 hours) at 10/17/10 1117 Last data filed at 10/17/10 1000  Gross per 24 hour  Intake    780 ml  Output   1500 ml  Net   -720 ml   Weight change: 0.4 kg (14.1 oz)  General appearance: alert, cooperative and no distress Resp: clear to auscultation bilaterally Chest wall: no tenderness Cardio: regular rate and rhythm, S1, S2 normal, no murmur, click, rub or gallop  Lab Results:  [facility] 10/15/10 0610 10/14/10 1516  NA 138 138  K 3.9 4.6  CL 99 98  CO2 34* 35*  GLUCOSE 98 118*  BUN 11 15  CREATININE <0.47* 0.68  CALCIUM 9.4 9.6    Basename 10/15/10 0610 10/14/10 1516  WBC 5.3 5.5  HGB 13.8 14.3  HCT 44.4 45.9  PLT 78* 72*    Studies/Results:  Medications:  Scheduled Meds:    . aspirin  81 mg Oral Daily  . famotidine  10 mg Oral Daily  . ferrous sulfate  325 mg Oral Daily  . folic acid  1 mg Oral Daily  . furosemide  20 mg Oral BID  . lisinopril  5 mg Oral Daily  . memantine  10 mg Oral Daily  . potassium chloride SA  20 mEq Oral Daily  . raloxifene  60 mg Oral Daily  . sodium chloride  3 mL Intravenous Q12H   Continuous Infusions:  PRN Meds:.hydrALAZINE, sodium chloride  Assessment/Plan: 1. Atrial flutter & Bradycardia, on telemetry with  more than 3.5 second pauses. On unexplained causes of falls. Heart rate has increased to greater than 4. Will continue to monitor on telemetry. I have stopped the donozepil and digoxin. He did family would really like to call to talk to her cardiologist about. As the need of a pacemaker has never been discussed with him them. 2. Diastolic CHF, chronic stable 3. HTN (hypertension), benign improved. 4. bilateral leg weakness physical therapy consult pending.  LOS: 3 days   [name], ABRAHAM 10/17/2010, 11:17 AM

## 2010-10-17 NOTE — Progress Notes (Signed)
*  PRELIMINARY RESULTS* Echocardiogram 2D Echocardiogram has been performed.  Danielle Walker 10/17/2010, 10:43 AM

## 2010-10-18 MED ORDER — LISINOPRIL 5 MG PO TABS: 5.0000 mg | ORAL_TABLET | Freq: Every day | ORAL | Status: DC

## 2010-10-18 NOTE — Progress Notes (Signed)
SUBJECTIVE: The patient is resting quietly without complaints of shortness of breath or pain she is pleasantly confused   Filed Vitals:   10/17/10 1900 10/17/10 2200 10/18/10 0600 10/18/10 0759  BP:  119/57 149/71   Pulse:  46 58 49  Temp:  98.5 F (36.9 C) 97.5 F (36.4 C)   TempSrc:  Oral Oral   Resp:  18 18   Height:      Weight:      SpO2: 96% 93% 95% 91%    Intake/Output Summary (Last 24 hours) at 10/18/10 0827 Last data filed at 10/18/10 0700  Gross per 24 hour  Intake    663 ml  Output    800 ml  Net   -137 ml    LABS: No results found for this or any previous visit (from the past 24 hour(s)).  RADIOLOGY: Ct Head Wo Contrast  10/14/2010  *RADIOLOGY REPORT*  Clinical Data: Left lower extremity weakness, altered mental status  CT HEAD WITHOUT CONTRAST  Technique:  Contiguous axial images were obtained from the base of the skull through the vertex without contrast.  Comparison: Head CT 02/12/2007  Findings: No intracranial hemorrhage.  No CT evidence of acute infarction.  No focal mass lesion.  No midline shift or mass effect.  No hydrocephalus.  Paranasal sinuses and mastoid air cells are clear.  Orbits are normal.  IMPRESSION: No acute intracranial findings.  No change from prior.  Original Report Authenticated By: Genevive Bi, M.D.   Dg Pelvis Portable  10/14/2010  *RADIOLOGY REPORT*  Clinical Data: Weakness, left hip/leg pain  PORTABLE PELVIS  Comparison: None.  Findings: No evidence of acute fracture or dislocation.  Prior dynamic hip screw fixation of right proximal femur fracture.  Bony pelvis otherwise appears intact.  IMPRESSION: No evidence of acute fracture or dislocation.  Prior ORIF of right proximal femur fracture.  Original Report Authenticated By: Charline Bills, M.D.   Dg Chest Portable 1 View  10/14/2010  *RADIOLOGY REPORT*  Clinical Data: Weakness.  Left hip and leg pain secondary to a fall.  PORTABLE CHEST - 1 VIEW  Comparison: 06/16/2010  Findings:  There is chronic marked cardiomegaly.  Pulmonary vascularity is within normal limits.  Lungs are clear.  No acute osseous abnormality.  IMPRESSION: Marked chronic cardiomegaly.  No acute abnormalities.  Original Report Authenticated By: Gwynn Burly, M.D.    PHYSICAL EXAM General: Well developed, well nourished, in no acute distress Head: Eyes PERRLA, No xanthomas.   Normal cephalic and atramatic  Lungs: Clear bilaterally to auscultation and percussion. Heart: HRRR S1 S2, with soft S4 murmur.  Pulses are 2+ & equal.            No carotid bruit. No JVD.  No abdominal bruits. No femoral bruits. Abdomen: Bowel sounds are positive, abdomen soft and non-tender without masses or                  Hernia's noted. Msk:  Back normal, normal gait. Normal strength and tone for age. Extremities: No clubbing, cyanosis or edema.  DP +1 Neuro: Alert and oriented X 3. Psych:  Good affect, responds appropriately  TELEMETRY: Reviewed telemetry pt in remains in slow atrial fibrillation with frequent pauses none greater than 2.6 seconds.  ASSESSMENT AND PLAN:  1. Atrial fibrillation with slow ventricular response: She continues stable and asymptomatic long discussion with patient's family concerning need for pacemaker. The patient would not be a candidate for pacemaker at this time she is continuing  to be stable without chest pain or dizziness or syncope there are no pauses greater than 2.6 on review of telemetry. Will plan followup appointment with Dr. wall post discharge.  2. Chronic diastolic CHF: There is no evidence of fluid overload at this time continue by mouth medications as directed.  Please see Dr. Molly Maduro Tahir Blank's note below for further recommendations. Bettey Mare. Lawrence NP  HR modestly increased and remains adequate.  No pauses greater than 3 seconds.  Pacing not required.  OK for discharge.  We will follow in office.  Manila Bing, MD

## 2010-10-18 NOTE — Discharge Summary (Addendum)
LYRICAL SOWLE MRN: [id number] DOB/AGE: 27-Jan-1924 77 y.o.  Admit date: 10/14/2010 Discharge date: 10/18/2010  Primary Care Physician:  Kirk Ruths, MD   Discharge Diagnoses:      A. fib/A. flutter with bradycardia   . Generalized abdominal swelling secondary to bradycardia   . Chronic atrial fibrillation, not a candidate for Coumadin secondary to falls   . Diastolic CHF, chronic     . HTN (hypertension), benign  . Bradycardia.    DISCHARGE MEDICATION: Current Discharge Medication List    START taking these medications   Details  lisinopril (PRINIVIL,ZESTRIL) 5 MG tablet Take 1 tablet (5 mg total) by mouth daily.      CONTINUE these medications which have NOT CHANGED   Details  aspirin 81 MG tablet Take 81 mg by mouth daily.      Calcium Carb-Cholecalciferol (CALCIUM PLUS VITAMIN D3) 600-500 MG-UNIT CAPS Take 1 capsule by mouth daily.      folic acid (FOLVITE) 1 MG tablet Take 1 mg by mouth daily.      furosemide (LASIX) 20 MG tablet Take 1 tablet (20 mg total) by mouth 2 (two) times daily. Qty: 180 tablet, Refills: 0    memantine (NAMENDA) 10 MG tablet Take 10 mg by mouth daily.      Multiple Vitamins-Minerals (MULTIVITAMIN WITH MINERALS) tablet Take 1 tablet by mouth daily.      potassium chloride SA (K-DUR,KLOR-CON) 20 MEQ tablet Take 1 tablet (20 mEq total) by mouth daily. Qty: 90 tablet, Refills: 0    raloxifene (EVISTA) 60 MG tablet Take 60 mg by mouth daily.      ranitidine (ZANTAC) 150 MG tablet Take 150 mg by mouth 2 (two) times daily.      solifenacin (VESICARE) 10 MG tablet Take 5 mg by mouth daily.        STOP taking these medications     digoxin (LANOXIN) 0.125 MG tablet      donepezil (ARICEPT) 10 MG tablet      Ferrous Sulfate (IRON) 325 (65 FE) MG TABS            Consults: Treatment Team:  Gerrit Friends. Rothbart, MD   SIGNIFICANT DIAGNOSTIC STUDIES:  Ct Head Wo Contrast  10/14/2010  *RADIOLOGY REPORT*  Clinical Data: Left  lower extremity weakness, altered mental status  CT HEAD WITHOUT CONTRAST  IMPRESSION: No acute intracranial findings.  No change from prior.  Original Report Authenticated By: Genevive Bi, M.D.   Dg Pelvis Portable  10/14/2010  *RADIOLOGY REPORT*  Clinical Data: Weakness, left hip/leg pain  PORTABLE PELVIS    IMPRESSION: No evidence of acute fracture or dislocation.  Prior ORIF of right proximal femur fracture.  Original Report Authenticated By: Charline Bills, M.D.   Dg Chest Portable 1 View  10/14/2010  *RADIOLOGY REPORT*  Clinical Data: Weakness.   IMPRESSION: Marked chronic cardiomegaly.  No acute abnormalities.  Original Report Authenticated By: Gwynn Burly, M.D.       BRIEF ADMITTING H & P: HISTORY OF PRESENT ILLNESS: Danielle Walker is an 77 year old pleasant  female with a history of dementia, Afib who was on digoxin, chronic  diastolic heart failure, who presents to the emergency department with  family members after several days of progressive worsening weakness.  She has not had any syncopal episodes. She has not been eating very  well. She currently is being taken care of by 2 aide during the day and  daughter stays with her at night. She does ambulate at her baseline  with assistance, but however, she has been having frequent falls. She  has not been feeling very well. She denies any pain. She does have  mild dementia. She denies any chest pain, abdominal pain. She has not  had any nausea, vomiting, diarrhea. She has not had any focal  neurological deficits. She comes into the ED with heart rate initially  around 40, currently is in the 50s and her blood pressure is elevated  with systolics around 200. We are being asked to admit the patient  because of her weakness. History is limited due to the patient's  dementia, but according to her daughter who is a healthcare power of  attorney Danielle Walker, there has been no obvious reason for her  weakness. They do not  have Home Health or Physical Therapy arranged at  home, they did with [place] and they were very satisfied with Genevieve Norlander in  the past, but they do not anything currently. She did not had any  recent changes to her medicines.  PHYSICAL EXAMINATION: VITALS: Temperature is 98.9, her pulse on her  EKG was initially 38. Her heart rate is now in the 50s-60s range,  respirations are documented as 40 but as I am seeing the patient her  respiratory rate is normal. Blood pressure 202/72, 96% O2 sats on room  air.  GENERAL: She is alert and in no apparent distress. She is only  oriented to person, not to place or time. Breathing normally with no  increased work of breathing.  HEENT: Extraocular movements are intact. Pupils are equal and reactive  to light. Oropharynx clear. Mucous membranes are moist.  NECK: No JVD. No carotid bruits.  HEART: Bradycardic and irregular, without any murmurs, rubs or gallops.  CHEST: Clear to auscultation bilaterally. No wheeze, rhonchi, or  rales.  ABDOMEN: Soft, nontender, nondistended. Positive bowel sounds. No  hepatosplenomegaly.  EXTREMITIES: No clubbing, cyanosis or edema.  PSYCH: Normal mood and affect. No agitation.  NEURO: No focal neurologic deficits. Cranial nerves II-XII grossly  intact without any abnormalities.  SKIN: No rashes.  LABORATORY DATA: Her white count is normal. Hemoglobin is normal at  14.3. Electrolytes are normal. Her platelets are low at 72. Chest x-  ray is negative for any infiltrate or heart failure. Cardiac enzymes  are negative. Her dig level is 1. Her LFTs are normal. Urinalysis is  unrevealing. CT of her head, pending. A 12-lead EKG shows a flutter  with a rate of 38 with no obvious acute changes.    Hospital Course:  .Atrial flutter/A. fib with bradycardia. He was admitted to telemetry floor was monitored.she had greater than 3 second pause. And was breaking down into the low 30s. Her digoxin in the hospital was sought.  Cardiology was consulted. As cause of her falls could be secondary to her bradycardia. Cardiology came discussed with the family the options. And family refused the pacemaker as it would not improve her quality of life. He decided to keep her off the drops and off the hospital. And take her home also get off hospice care consult follow her at home.  Marland KitchenHTN (hypertension), benign, blood pressure 121/40 9/71 with a discharge currently well controlled on current medications.  .Chronic atrial fibrillation, as for #1.continue aspirin.  .Diastolic CHF, chronic: Patient has remained stable from his diastolic heart failure standpoint. We have told her digoxin. We'll need to try to avoid beta blockers as an outpatient. We'll continue her Lasix and follow her potassium and treat as needed. Continue  ACE. Was not discharge on beta blocker due to bradycardia.  Bilateral leg weakness probably secondary to #1.    Disposition and Follow-up:  Discharge Orders    Future Appointments: Provider: Department: Dept Phone: Center:   11/03/2010 10:00 AM Valera Castle, MD [facility] [phone] [facility]   11/21/2010 9:50 AM [facility] [phone] [facility]     Future Orders Please Complete By Expires   Diet - low sodium heart healthy      Increase activity slowly      Discharge instructions      Comments:   All of your primary care doctor and cardiologist as needed.   Call MD for:      Scheduling Instructions:   If you feel more weak than usual.  If you feel palpitations.      DISCHARGE EXAM:  General appearance: alert, cooperative and no distress  Resp: clear to auscultation bilaterally  Chest wall: no tenderness  Cardio: regular rate and rhythm, S1, S2 normal, no murmur, click, rub or gallop  Vitals on the day of discharge: Blood pressure 149/71, pulse 49, temperature 97.5 F (36.4 C), temperature source Oral, resp. rate 18, height 5\' 4"  (1.626 m), weight 86.9 kg (191 lb 9.3 oz),  SpO2 91.00%.  Labs ON discharge: None   Signed: [name], ABRAHAM 10/18/2010, 10:05 AM

## 2010-10-18 NOTE — Plan of Care (Signed)
Problem: Phase III Progression Outcomes Goal: Foley discontinued Outcome: Not Applicable Date Met:  10/18/10 No foley

## 2010-10-18 NOTE — Progress Notes (Signed)
Patient was given discharge instructions along with follow up appointments. Patient discharged on home 02 and tank was delivered prior to discharge. Patient and family instructed on using tank. Patient was escorted by staff via wheelchair to vehicle. Patient discharged to home in stable condition.

## 2010-10-18 NOTE — Progress Notes (Signed)
Physical Therapy Evaluation Patient Name: Danielle Walker Date: 10/18/2010 Problem List:  Patient Active Problem List  Diagnoses  . Generalized abdominal swelling  . Chronic atrial fibrillation  . Diastolic CHF, chronic  . Atrial flutter  . HTN (hypertension), benign   Past Medical History:  Past Medical History  Diagnosis Date  . Arrhythmia   . CHF (congestive heart failure)   . Dementia   . Osteoporosis   . Hypertension   . GERD (gastroesophageal reflux disease)   . Anemia   . Blood transfusion    Past Surgical History:  Past Surgical History  Procedure Date  . Hip fracture surgery   . Appendectomy     Precautions/Restrictions  Precautions Precautions: Fall Required Braces or Orthoses: No Restrictions Weight Bearing Restrictions: No Prior Functioning  Home Living Type of Home: House Lives With: Alone Receives Help From: Family;Personal care attendant Home Layout: One level Home Access: Stairs to enter Entrance Stairs-Rails: Right Entrance Stairs-Number of Steps: 2-3 Bathroom Toilet: Standard Additional Comments: pt unable to give much detail due to dementia Prior Function Level of Independence: Needs assistance with ADLs;Needs assistance with homemaking;Needs assistance with gait;Needs assistance with tranfers Vocation: Retired Financial risk analyst Arousal/Alertness: Awake/alert Overall Cognitive Status: Appears within functional limits for tasks assessed Orientation Level: Oriented X4 Cognition - Other Comments: poor memory Sensation/Coordination Sensation Light Touch: Appears Intact Proprioception: Appears Intact Extremity Assessment RUE Assessment RUE Assessment: Within Functional Limits LUE Assessment LUE Assessment: Within Functional Limits RLE Assessment RLE Assessment: Exceptions to [facility] RLE Strength RLE Overall Strength:  (generally 2/5) LLE Assessment LLE Assessment: Exceptions to [facility] LLE Strength LLE Overall Strength Comments:  very deconditioned with strength generally 2/5 Mobility (including Balance) Bed Mobility Bed Mobility: Yes Supine to Sit: 4: Min assist Transfers Transfers: Yes Sit to Stand: 3: Mod assist Sit to Stand Details (indicate cue type and reason): has difficulty shifting weight anteriorly Stand to Sit: 3: Mod assist Stand to Sit Details: cues to get positioned correctly, control descent Stand Pivot Transfer Details (indicate cue type and reason): advancing and turning walker/ feet very slow and labored Ambulation/Gait Ambulation/Gait: No (too weak to ambulate) Stairs: No Wheelchair Mobility Wheelchair Mobility: No  Posture/Postural Control Posture/Postural Control: Postural limitations Postural Limitations: severe thoracic kyphoscoliosis Balance Balance Assessed: Yes Dynamic Sitting Balance Dynamic Sitting - Level of Assistance: 7: Independent Exercise     End of Session PT - End of Session Equipment Utilized During Treatment: Gait belt Activity Tolerance: Patient tolerated treatment well;Patient limited by fatigue Patient left: in chair;with call bell in reach Nurse Communication: Mobility status for transfers General Behavior During Session: [facility] for tasks performed Cognition: [facility] for tasks performed PT Assessment/Plan/Recommendation PT Assessment Clinical Impression Statement: extremely deconditioned due to recent medical illness and inactivity.-now unable to ambulate PT Recommendation/Assessment: Patient will need skilled PT in the acute care venue PT Problem List: Decreased strength;Decreased activity tolerance;Decreased mobility;Decreased knowledge of use of DME;Decreased knowledge of precautions Barriers to Discharge: None PT Therapy Diagnosis : Difficulty walking;Generalized weakness PT Plan PT Frequency: Min 3X/week PT Treatment/Interventions: DME instruction;Gait training;Therapeutic exercise;Patient/family education PT Goals  Acute Rehab PT Goals PT Goal  Formulation: With patient Time For Goal Achievement: 2 weeks Pt will go Sit to Supine/Side: with supervision Pt will Transfer Sit to Stand/Stand to Sit: with min assist Pt will Transfer Bed to Chair/Chair to Bed: with min assist Pt will Stand: with min assist Pt will Ambulate: 1 - 15 feet;with mod assist;with rolling walker Myrlene Broker L 10/18/2010, 11:19 AM

## 2010-10-19 NOTE — Progress Notes (Signed)
10/18/2010 pt d/c home with home health referral to [facility] and home o2 from [employer]

## 2010-10-26 ENCOUNTER — Ambulatory Visit (HOSPITAL_COMMUNITY)
Admission: RE | Admit: 2010-10-26 | Discharge: 2010-10-26 | Disposition: A | Discharge status: Home / Self Care | Payer: Medicare Other | Source: Ambulatory Visit | Attending: Internal Medicine | Admitting: Internal Medicine

## 2010-10-26 ENCOUNTER — Other Ambulatory Visit (HOSPITAL_COMMUNITY): Payer: Self-pay | Admitting: Internal Medicine

## 2010-10-26 ENCOUNTER — Encounter: Payer: Self-pay | Admitting: Physician Assistant

## 2010-10-26 ENCOUNTER — Ambulatory Visit (INDEPENDENT_AMBULATORY_CARE_PROVIDER_SITE_OTHER): Payer: Medicare Other | Admitting: Physician Assistant

## 2010-10-26 DIAGNOSIS — I509 Heart failure, unspecified: Secondary | ICD-10-CM

## 2010-10-26 DIAGNOSIS — I4891 Unspecified atrial fibrillation: Secondary | ICD-10-CM

## 2010-10-26 DIAGNOSIS — M25569 Pain in unspecified knee: Secondary | ICD-10-CM | POA: Insufficient documentation

## 2010-10-26 DIAGNOSIS — W19XXXA Unspecified fall, initial encounter: Secondary | ICD-10-CM | POA: Insufficient documentation

## 2010-10-26 DIAGNOSIS — I10 Essential (primary) hypertension: Secondary | ICD-10-CM

## 2010-10-26 DIAGNOSIS — S99929A Unspecified injury of unspecified foot, initial encounter: Secondary | ICD-10-CM

## 2010-10-26 DIAGNOSIS — I5032 Chronic diastolic (congestive) heart failure: Secondary | ICD-10-CM

## 2010-10-26 DIAGNOSIS — S8990XA Unspecified injury of unspecified lower leg, initial encounter: Secondary | ICD-10-CM | POA: Insufficient documentation

## 2010-10-26 DIAGNOSIS — I4892 Unspecified atrial flutter: Secondary | ICD-10-CM

## 2010-10-26 LAB — BASIC METABOLIC PANEL
BUN: 16 mg/dL (ref 6–23)
Calcium: 9.8 mg/dL (ref 8.4–10.5)
Creat: 0.7 mg/dL (ref 0.50–1.10)
Potassium: 4.4 mEq/L (ref 3.5–5.3)

## 2010-10-26 NOTE — Patient Instructions (Signed)
**Note De-identified  Obfuscation** Your physician recommends that you continue on your current medications as directed. Please refer to the Current Medication list given to you today.  Your physician recommends that you return for lab work in: today  Your physician recommends that you schedule a follow-up appointment in: 2 months   

## 2010-10-26 NOTE — Assessment & Plan Note (Signed)
Patient has slow atrial flutter of 52 beats per minute. Her Lanoxin was stopped. She is not on any  rate lowering medications at this time. We will continue to monitor her. She may need a pacemaker in the future.

## 2010-10-26 NOTE — Progress Notes (Signed)
HPI: This is an 77 year old who had a recent hospitalization with diastolic heart failure as well as atrial fibrillation with pauses and weakness. Her digoxin was stopped. She had a 2-D echo 10/17/10 that showed mild concentric LVH, ejection fraction 60-65%, with moderate left atrial dilatation, moderate to severe RA dilatation.  Patient main complaint today is knee pain for which he had an x-ray today and general weakness and inability to walk like she used to. She is being worked up by [employer].  Patient denies any chest pain, palpitations, dyspnea, or presyncope. Patient's weight has been stable and she denies edema.She has occasional dizziness when she changes positions.    Allergies  Allergen Reactions  . Penicillins   . Sulfa Antibiotics Itching    Current Outpatient Prescriptions on File Prior to Visit  Medication Sig Dispense Refill  . aspirin 81 MG tablet Take 81 mg by mouth daily.        . Calcium Carb-Cholecalciferol (CALCIUM PLUS VITAMIN D3) 600-500 MG-UNIT CAPS Take 1 capsule by mouth daily.        . folic acid (FOLVITE) 1 MG tablet Take 1 mg by mouth daily.        . furosemide (LASIX) 20 MG tablet Take 1 tablet (20 mg total) by mouth 2 (two) times daily.  180 tablet  0  . lisinopril (PRINIVIL,ZESTRIL) 5 MG tablet Take 1 tablet (5 mg total) by mouth daily.  30 tablet  0  . memantine (NAMENDA) 10 MG tablet Take 10 mg by mouth daily.        . potassium chloride SA (K-DUR,KLOR-CON) 20 MEQ tablet Take 1 tablet (20 mEq total) by mouth daily.  90 tablet  0  . raloxifene (EVISTA) 60 MG tablet Take 60 mg by mouth daily.        . ranitidine (ZANTAC) 150 MG tablet Take 150 mg by mouth 2 (two) times daily.          Past Medical History  Diagnosis Date  . Arrhythmia   . CHF (congestive heart failure)   . Dementia   . Osteoporosis   . Hypertension   . GERD (gastroesophageal reflux disease)   . Anemia   . Blood transfusion     Past Surgical History  Procedure Date  . Hip  fracture surgery   . Appendectomy     Family History  Problem Relation Age of Onset  . Heart disease Mother   . Heart disease Father     History   Social History  . Marital Status: Widowed    Spouse Name: N/A    Number of Children: N/A  . Years of Education: N/A   Occupational History  . Not on file.   Social History Main Topics  . Smoking status: Former Games developer  . Smokeless tobacco: Never Used  . Alcohol Use: No  . Drug Use: No  . Sexually Active: No   Other Topics Concern  . Not on file   Social History Narrative  . No narrative on file    ROS See HPI Eyes: Negative Ears:Negative for hearing loss, tinnitus Cardiovascular: Negative for chest pain, palpitations,irregular heartbeat, dyspnea, dyspnea on exertion, near-syncope, orthopnea, paroxysmal nocturnal dyspnia and syncope,edema, claudication, cyanosis,.  Respiratory:   Negative for cough, hemoptysis, shortness of breath, sleep disturbances due to breathing, sputum production and wheezing.   Endocrine: Negative for cold intolerance and heat intolerance.  Hematologic/Lymphatic: Negative for adenopathy and bleeding problem. Does not bruise/bleed easily.  Musculoskeletal:left knee pain and trouble walking.  Gastrointestinal: Negative for nausea, vomiting, reflux, abdominal pain, diarrhea, constipation.   Genitourinary: Negative for bladder incontinence, dysuria, flank pain, frequency, hematuria, hesitancy, nocturia and urgency.  Neurological: Negative.  Allergic/Immunologic: Negative for environmental allergies.   PHYSICAL EXAM: Well-nournished, in no acute distress.Wearing oxygen and in a wheelchair Neck: No JVD, HJR, Bruit, or thyroid enlargement Lungs: No tachypnea, clear without wheezing, rales, or rhonchi Cardiovascular: irregular PMI not displaced,  2/6 systolic murmur at the left sternal border, no bruit, thrill, or heave. Abdomen: BS normal. Soft without organomegaly, masses, lesions or  tenderness. Extremities:right foot and small boot, without cyanosis, clubbing or edema. Good distal pulses bilateral SKin: Warm, no lesions or rashes  Musculoskeletal: No deformities Neuro: no focal signs  BP 134/66  Pulse 51  Ht 6' (1.829 m)  Wt 189 lb (85.73 kg)  BMI 25.63 kg/m2  SpO2 95%  UXL:KGMWNU flutter at 52 beats per minute

## 2010-10-26 NOTE — Assessment & Plan Note (Addendum)
Patient's heart failure is well controlled. There is no evidence of fluid overload today.Follow-up labs.

## 2010-10-26 NOTE — Assessment & Plan Note (Signed)
Blood pressure is much better today

## 2010-11-03 ENCOUNTER — Ambulatory Visit: Payer: Medicare Other | Admitting: Cardiology

## 2010-11-03 ENCOUNTER — Telehealth: Payer: Self-pay | Admitting: Cardiology

## 2010-11-03 NOTE — Telephone Encounter (Signed)
Would like to discuss drop in patient's HR with Dr.Wall's nurse / tg

## 2010-11-03 NOTE — Telephone Encounter (Signed)
**Note De-Identified  Obfuscation** Beth, pt's daughter, states that pt's HR is 44 and that they were advised at last OV with Leda Gauze, PA to monitor HR and to report low readings. She states pt's HR is 44. Offered to schedule appt. but Beth states that pt's HR runs low often and that if HR drops lower than this she will call back to schedule appt. or she will call 911 if this occurs after hours.

## 2010-11-21 ENCOUNTER — Ambulatory Visit (HOSPITAL_COMMUNITY): Payer: Medicare Other

## 2010-12-23 ENCOUNTER — Ambulatory Visit (INDEPENDENT_AMBULATORY_CARE_PROVIDER_SITE_OTHER): Payer: Medicare Other | Admitting: Adult Health

## 2010-12-23 ENCOUNTER — Encounter: Payer: Self-pay | Admitting: Adult Health

## 2010-12-23 DIAGNOSIS — I10 Essential (primary) hypertension: Secondary | ICD-10-CM

## 2010-12-23 DIAGNOSIS — I509 Heart failure, unspecified: Secondary | ICD-10-CM

## 2010-12-23 DIAGNOSIS — I5032 Chronic diastolic (congestive) heart failure: Secondary | ICD-10-CM

## 2010-12-23 MED ORDER — POTASSIUM CHLORIDE CRYS ER 20 MEQ PO TBCR: 20.0000 meq | EXTENDED_RELEASE_TABLET | Freq: Every day | ORAL | Status: DC

## 2010-12-23 MED ORDER — LISINOPRIL 5 MG PO TABS: 5.0000 mg | ORAL_TABLET | Freq: Every day | ORAL | Status: DC

## 2010-12-23 MED ORDER — FUROSEMIDE 20 MG PO TABS: 20.0000 mg | ORAL_TABLET | Freq: Two times a day (BID) | ORAL | Status: DC

## 2010-12-23 NOTE — Assessment & Plan Note (Signed)
Essentially well controlled. She is doing well. No changes in medication regimen.

## 2010-12-23 NOTE — Assessment & Plan Note (Signed)
She is doing very well and is the best I have seen her since discharge from hospitalization. She is bright, cheerful and without symptoms. Lungs are clear and she is has no edema.  Labs are WNL. I will refill her lisinopril, lasix, and potassium.  She will follow-up in 4 months with Dr. Dietrich Pates, knowing she can come sooner should there be any issues or problems.

## 2010-12-23 NOTE — Progress Notes (Signed)
HPI: Mrs. Habermann is a 77 y/o patient of Dr. Dietrich Pates we are following for ongoing assessment and treatment of atrial fib, not a coumadin candidate, diastolic dysfx, hyptertension.  She comes today on 2 month follow-up. She is without complaint of shortness of breath, dizziness, chest pain, or palpitations. She walks with a rolling walker, has excellent family support and is compliant with medications.  Most recent labs per October 26, 2010 show Creatinine of 0.70, Sodium 138, K+ 4.4.  Wt remains stable, but up 2lbs since last visit.  Allergies  Allergen Reactions  . Penicillins   . Sulfa Antibiotics Itching    Current Outpatient Prescriptions  Medication Sig Dispense Refill  . aspirin 81 MG tablet Take 81 mg by mouth daily.        . Calcium Carb-Cholecalciferol (CALCIUM PLUS VITAMIN D3) 600-500 MG-UNIT CAPS Take 1 capsule by mouth daily.        Marland Kitchen donepezil (ARICEPT) 10 MG tablet Take 10 mg by mouth at bedtime.        . folic acid (FOLVITE) 1 MG tablet Take 1 mg by mouth daily.        . furosemide (LASIX) 20 MG tablet Take 1 tablet (20 mg total) by mouth 2 (two) times daily.  180 tablet  3  . lisinopril (PRINIVIL,ZESTRIL) 5 MG tablet Take 1 tablet (5 mg total) by mouth daily.  30 tablet  3  . memantine (NAMENDA) 10 MG tablet Take 10 mg by mouth daily.        . potassium chloride SA (K-DUR,KLOR-CON) 20 MEQ tablet Take 1 tablet (20 mEq total) by mouth daily.  90 tablet  3  . raloxifene (EVISTA) 60 MG tablet Take 60 mg by mouth daily.        . ranitidine (ZANTAC) 150 MG tablet Take 150 mg by mouth 2 (two) times daily.        Marland Kitchen DISCONTD: furosemide (LASIX) 20 MG tablet Take 1 tablet (20 mg total) by mouth 2 (two) times daily.  180 tablet  0  . DISCONTD: potassium chloride SA (K-DUR,KLOR-CON) 20 MEQ tablet Take 1 tablet (20 mEq total) by mouth daily.  90 tablet  0    Past Medical History  Diagnosis Date  . Arrhythmia   . CHF (congestive heart failure)   . Dementia   . Osteoporosis   .  Hypertension   . GERD (gastroesophageal reflux disease)   . Anemia   . Blood transfusion     Past Surgical History  Procedure Date  . Hip fracture surgery   . Appendectomy     UXL:[id number] of systems complete and found to be negative unless listed above PHYSICAL EXAM BP 140/63  Pulse 59  Ht 5\' 10"  (1.778 m)  Wt 191 lb (86.637 kg)  BMI 27.41 kg/m2  SpO2 97%  General: Well developed, well nourished, in no acute distress Head: Eyes PERRLA, No xanthomas.   Normal cephalic and atramatic  Lungs: Clear bilaterally to auscultation and percussion. Heart: HRRR S1 S2, some irregular systole, 1/6 systolic murmur..  Pulses are 2+ & equal.            No carotid bruit. No JVD.  No abdominal bruits. No femoral bruits. Abdomen: Bowel sounds are positive, abdomen soft and non-tender without masses or                  Hernia's noted. Msk:  Back normal,slow gait. Diminshed strength and tone for age, uses a rolling walker for ambulation. Extremities:  No clubbing, cyanosis or edema.  DP +1 Neuro: Alert and oriented X 3. Psych:  Good affect, responds appropriately   ASSESSMENT AND PLAN

## 2010-12-23 NOTE — Patient Instructions (Signed)
Your physician recommends that you schedule a follow-up appointment in: 4 months  

## 2010-12-27 LAB — IRON AND TIBC
Iron: 67
Saturation Ratios: 20
TIBC: 334
UIBC: 267

## 2010-12-27 LAB — CBC
RBC: 4.09
WBC: 4.4

## 2010-12-27 LAB — FERRITIN: Ferritin: 157 (ref 10–291)

## 2011-01-03 ENCOUNTER — Ambulatory Visit (HOSPITAL_COMMUNITY)
Admission: RE | Admit: 2011-01-03 | Discharge: 2011-01-03 | Disposition: A | Discharge status: Home / Self Care | Payer: Medicare Other | Source: Ambulatory Visit | Attending: Family Medicine | Admitting: Family Medicine

## 2011-01-03 DIAGNOSIS — Z1231 Encounter for screening mammogram for malignant neoplasm of breast: Secondary | ICD-10-CM | POA: Insufficient documentation

## 2011-01-03 DIAGNOSIS — Z139 Encounter for screening, unspecified: Secondary | ICD-10-CM

## 2011-01-03 LAB — DIFFERENTIAL
Basophils Absolute: 0
Eosinophils Absolute: 0.1 — ABNORMAL LOW
Lymphocytes Relative: 19
Lymphs Abs: 0.9
Neutrophils Relative %: 67

## 2011-01-03 LAB — BASIC METABOLIC PANEL
BUN: 7
Creatinine, Ser: 0.56
GFR calc non Af Amer: >60

## 2011-01-03 LAB — URINALYSIS, ROUTINE W REFLEX MICROSCOPIC
Glucose, UA: 100 — AB
Nitrite: NEGATIVE
Protein, ur: NEGATIVE

## 2011-01-03 LAB — CBC
MCV: 90.7
Platelets: 100 — ABNORMAL LOW
RDW: 14.5
WBC: 4.7

## 2011-01-04 LAB — IRON AND TIBC
Iron: 61
UIBC: 244

## 2011-03-31 ENCOUNTER — Ambulatory Visit: Payer: Medicare Other | Admitting: Adult Health

## 2011-04-26 ENCOUNTER — Ambulatory Visit: Payer: Medicare Other | Admitting: Adult Health

## 2011-04-27 ENCOUNTER — Encounter: Payer: Self-pay | Admitting: Adult Health

## 2011-04-27 ENCOUNTER — Ambulatory Visit (INDEPENDENT_AMBULATORY_CARE_PROVIDER_SITE_OTHER): Payer: BLUE CROSS/BLUE SHIELD | Admitting: Adult Health

## 2011-04-27 DIAGNOSIS — I10 Essential (primary) hypertension: Secondary | ICD-10-CM

## 2011-04-27 DIAGNOSIS — I4891 Unspecified atrial fibrillation: Secondary | ICD-10-CM

## 2011-04-27 DIAGNOSIS — R040 Epistaxis: Secondary | ICD-10-CM

## 2011-04-27 LAB — COMPREHENSIVE METABOLIC PANEL
AST: 21 U/L (ref 0–37)
Albumin: 4.2 g/dL (ref 3.5–5.2)
BUN: 25 mg/dL — ABNORMAL HIGH (ref 6–23)
CO2: 34 mEq/L — ABNORMAL HIGH (ref 19–32)
Calcium: 9.6 mg/dL (ref 8.4–10.5)
Chloride: 99 mEq/L (ref 96–112)
Creat: 0.82 mg/dL (ref 0.50–1.10)
Glucose, Bld: 104 mg/dL — ABNORMAL HIGH (ref 70–99)
Potassium: 4.5 mEq/L (ref 3.5–5.3)

## 2011-04-27 MED ORDER — LISINOPRIL 10 MG PO TABS: 10.0000 mg | ORAL_TABLET | Freq: Every day | ORAL | Status: DC

## 2011-04-27 MED ORDER — SALINE NASAL SPRAY 0.65 % NA SOLN: 1.0000 | NASAL | Status: DC | PRN

## 2011-04-27 MED ORDER — FUROSEMIDE 20 MG PO TABS: 20.0000 mg | ORAL_TABLET | Freq: Two times a day (BID) | ORAL | Status: DC

## 2011-04-27 MED ORDER — POTASSIUM CHLORIDE CRYS ER 20 MEQ PO TBCR: 20.0000 meq | EXTENDED_RELEASE_TABLET | Freq: Every day | ORAL | Status: DC

## 2011-04-27 NOTE — Assessment & Plan Note (Signed)
Found to be elevated today. Her caregiver states that with her weight gain the BP has been up into the high 150-160's systolic. Will increase lisinopril to 10 mg daily. Check a BMET as well.

## 2011-04-27 NOTE — Progress Notes (Signed)
   HPI: Danielle Walker is a friendly 78 y/o patient of Dr. Dietrich Pates we are following for ongoing assessment and treatment of Chronic atrial fib, not a coumadin candidate, diastolic dysfx, and hypertension. She has a history of O2 dependent COPD, and severe deconditioning with use of walker for ambulation. She comes today without complaint. She has gain 18 lbs since being seen last, with a very good appetite. She denies chest pain, DOE, edema. She has complaints of eppitaxis of the right nares.   Allergies  Allergen Reactions  . Penicillins   . Sulfa Antibiotics Itching    Current Outpatient Prescriptions  Medication Sig Dispense Refill  . aspirin 81 MG tablet Take 81 mg by mouth daily.        . Calcium Carb-Cholecalciferol (CALCIUM PLUS VITAMIN D3) 600-500 MG-UNIT CAPS Take 1 capsule by mouth daily.        Marland Kitchen donepezil (ARICEPT) 10 MG tablet Take 10 mg by mouth at bedtime.        . folic acid (FOLVITE) 1 MG tablet Take 1 mg by mouth daily.        . furosemide (LASIX) 20 MG tablet Take 1 tablet (20 mg total) by mouth 2 (two) times daily.  180 tablet  3  . lisinopril (PRINIVIL,ZESTRIL) 5 MG tablet Take 1 tablet (5 mg total) by mouth daily.  30 tablet  3  . memantine (NAMENDA) 10 MG tablet Take 10 mg by mouth daily.        . potassium chloride SA (K-DUR,KLOR-CON) 20 MEQ tablet Take 1 tablet (20 mEq total) by mouth daily.  90 tablet  3  . raloxifene (EVISTA) 60 MG tablet Take 60 mg by mouth daily.        . ranitidine (ZANTAC) 150 MG tablet Take 150 mg by mouth 2 (two) times daily.          Past Medical History  Diagnosis Date  . Arrhythmia   . CHF (congestive heart failure)   . Dementia   . Osteoporosis   . Hypertension   . GERD (gastroesophageal reflux disease)   . Anemia   . Blood transfusion     Past Surgical History  Procedure Date  . Hip fracture surgery   . Appendectomy     ZOX:WRUEAV of systems complete and found to be negative unless listed above PHYSICAL EXAM BP 163/74   Pulse 71  Resp 18  Ht 5\' 9"  (1.753 m)  Wt 209 lb (94.802 kg)  BMI 30.86 kg/m2  General: Well developed, well nourished, in no acute distress Head: Eyes PERRLA, No xanthomas.   Normal cephalic and atramatic  Lungs: Clear bilaterally to auscultation and percussion. Heart: Irregular heart rate,.  Pulses are 2+ & equal.            No carotid bruit. No JVD.  No abdominal bruits. No femoral bruits. Abdomen: Bowel sounds are positive, abdomen soft and non-tender without masses or                  Hernia's noted. Msk: Kyphosis noted, normal gait. Diminished overalll strength and tone for age. Extremities: No clubbing, cyanosis or edema.  DP +1 Neuro: Alert and oriented X 3. Psych:  Good affect, responds appropriately  EKG: Atrial fibrillation/flutter rate of 71 bpm.  ASSESSMENT AND PLAN

## 2011-04-27 NOTE — Patient Instructions (Signed)
**Note De-Identified  Obfuscation** Your physician has recommended you make the following change in your medication: increase Lisinopril to 10 mg daily, start taking Z-pack for 1 week, start using Na Sol nasal spray to both nostrils every 4 hours as needed and Neosporin ointment to nares twice daily.  Your physician recommends that you return for lab work in: today  Your physician recommends that you schedule a follow-up appointment in: 6 months

## 2011-04-27 NOTE — Assessment & Plan Note (Signed)
I suspect that this is related to her use of [place] O2. I have examined her nares with light and found a sore on the nasal septum with bright red and dried blood in the right nares. I have given her a Rx for NaSol solution, Z-Pack to treat for bacterial infection and topical neosporin.

## 2011-04-27 NOTE — Assessment & Plan Note (Signed)
Heart rate is well controlled. She has no complaints of DOE, or racing heart rate. Will continue current medications. She is not on a rate control medication or coumadin at this time.

## 2011-04-28 ENCOUNTER — Encounter: Payer: Self-pay | Admitting: *Deleted

## 2011-04-28 LAB — CBC WITH DIFFERENTIAL/PLATELET
Basophils Relative: 0 % (ref 0–1)
HCT: 40 % (ref 36.0–46.0)
Hemoglobin: 12.4 g/dL (ref 12.0–15.0)
Lymphocytes Relative: 20 % (ref 12–46)
Lymphs Abs: 1.3 10*3/uL (ref 0.7–4.0)
Monocytes Absolute: 0.7 10*3/uL (ref 0.1–1.0)
Monocytes Relative: 12 % (ref 3–12)
Neutro Abs: 4.1 10*3/uL (ref 1.7–7.7)
Neutrophils Relative %: 66 % (ref 43–77)
RBC: 4 MIL/uL (ref 3.87–5.11)
WBC: 6.2 10*3/uL (ref 4.0–10.5)

## 2011-05-08 ENCOUNTER — Telehealth: Payer: Self-pay | Admitting: Cardiology

## 2011-05-08 MED ORDER — LISINOPRIL 10 MG PO TABS: 10.0000 mg | ORAL_TABLET | Freq: Every day | ORAL | Status: DC

## 2011-05-08 NOTE — Telephone Encounter (Signed)
Patient needs Lisinopril 10mg  called to [facility] in [place].  Please do 90 day supply. / tg

## 2011-05-08 NOTE — Telephone Encounter (Signed)
RX sent to [facility] in [place] for refill./LV

## 2011-07-12 ENCOUNTER — Other Ambulatory Visit: Payer: Self-pay | Admitting: Cardiology

## 2011-07-12 MED ORDER — FUROSEMIDE 20 MG PO TABS: 20.0000 mg | ORAL_TABLET | Freq: Two times a day (BID) | ORAL | Status: DC

## 2011-07-12 MED ORDER — POTASSIUM CHLORIDE CRYS ER 20 MEQ PO TBCR: 20.0000 meq | EXTENDED_RELEASE_TABLET | Freq: Every day | ORAL | Status: DC

## 2011-07-12 NOTE — Telephone Encounter (Signed)
Please send these to [facility].  Patient has changed Pharmacies. Danielle Walker

## 2011-10-13 ENCOUNTER — Ambulatory Visit (INDEPENDENT_AMBULATORY_CARE_PROVIDER_SITE_OTHER): Payer: Medicare Other | Admitting: Adult Health

## 2011-10-13 ENCOUNTER — Encounter: Payer: Self-pay | Admitting: Adult Health

## 2011-10-13 VITALS — BP 149/82 | HR 71 | Ht 71.0 in | Wt 219.1 lb

## 2011-10-13 DIAGNOSIS — I482 Chronic atrial fibrillation, unspecified: Secondary | ICD-10-CM

## 2011-10-13 DIAGNOSIS — I5032 Chronic diastolic (congestive) heart failure: Secondary | ICD-10-CM

## 2011-10-13 DIAGNOSIS — I4891 Unspecified atrial fibrillation: Secondary | ICD-10-CM

## 2011-10-13 DIAGNOSIS — I10 Essential (primary) hypertension: Secondary | ICD-10-CM

## 2011-10-13 DIAGNOSIS — I509 Heart failure, unspecified: Secondary | ICD-10-CM

## 2011-10-13 LAB — CBC
Hemoglobin: 12.5 g/dL (ref 12.0–15.0)
MCH: 30.1 pg (ref 26.0–34.0)
MCV: 93.5 fL (ref 78.0–100.0)
Platelets: 102 10*3/uL — ABNORMAL LOW (ref 150–400)
RBC: 4.15 MIL/uL (ref 3.87–5.11)
WBC: 6.6 10*3/uL (ref 4.0–10.5)

## 2011-10-13 LAB — BASIC METABOLIC PANEL
CO2: 32 mEq/L (ref 19–32)
Calcium: 9.7 mg/dL (ref 8.4–10.5)
Chloride: 105 mEq/L (ref 96–112)
Creat: 0.81 mg/dL (ref 0.50–1.10)
Glucose, Bld: 100 mg/dL — ABNORMAL HIGH (ref 70–99)
Sodium: 143 mEq/L (ref 135–145)

## 2011-10-13 MED ORDER — LISINOPRIL 10 MG PO TABS: 10.0000 mg | ORAL_TABLET | Freq: Every day | ORAL | Status: DC

## 2011-10-13 MED ORDER — FUROSEMIDE 20 MG PO TABS: 20.0000 mg | ORAL_TABLET | Freq: Two times a day (BID) | ORAL | Status: DC

## 2011-10-13 MED ORDER — POTASSIUM CHLORIDE CRYS ER 20 MEQ PO TBCR: 20.0000 meq | EXTENDED_RELEASE_TABLET | Freq: Every day | ORAL | Status: DC

## 2011-10-13 NOTE — Assessment & Plan Note (Signed)
Rate is well controlled. Not a coumadin candidate due to GI bleeding and deconditioning.

## 2011-10-13 NOTE — Assessment & Plan Note (Signed)
Despite wt gain, no evidence of fluid overload or edema on exam.  Continue current medications and salt restriction.

## 2011-10-13 NOTE — Patient Instructions (Addendum)
Your physician recommends that you schedule a follow-up appointment in: 6 months  Your physician recommends that you return for lab work in: within the week

## 2011-10-13 NOTE — Assessment & Plan Note (Signed)
She is tolerating increased dose of lisinopril without coughing or hypotension. She has gained wt of 10 lbs since being seen last without evidence of CHF. She has a cook at home who is making foods without salt, but also making a lot of bakery items. She states she is not eating a lot but is not very active. Will refill her medications : Lisinopril, potassium and lasix today. Follow up labs will be completed for kidney fx.

## 2011-10-13 NOTE — Progress Notes (Signed)
   HPI: Danielle Walker is a pleasant 78 y/o patient of Dr. Dietrich Pates we are following for ongoing assessment and treatment of atrial fib, not a coumadin candidate secondary to significant deconditioning. She has a history of O2 dependent COPD. She is doing well today and is without complaint of chest pain, elevated HR or palpitations. Breathing status is stable. On last visit she was hypertensive and I increased her lisinopril to 10 mg daily. Follow up labs were WNL.  Allergies  Allergen Reactions  . Penicillins   . Sulfa Antibiotics Itching    Current Outpatient Prescriptions  Medication Sig Dispense Refill  . aspirin 81 MG tablet Take 81 mg by mouth daily.        . Calcium Carb-Cholecalciferol (CALCIUM PLUS VITAMIN D3) 600-500 MG-UNIT CAPS Take 1 capsule by mouth daily.        Marland Kitchen donepezil (ARICEPT) 10 MG tablet Take 10 mg by mouth at bedtime.        . folic acid (FOLVITE) 1 MG tablet Take 1 mg by mouth daily.        . furosemide (LASIX) 20 MG tablet Take 1 tablet (20 mg total) by mouth 2 (two) times daily.  180 tablet  1  . lisinopril (PRINIVIL,ZESTRIL) 10 MG tablet Take 1 tablet (10 mg total) by mouth daily.  90 tablet  1  . memantine (NAMENDA) 10 MG tablet Take 10 mg by mouth daily.        . potassium chloride SA (K-DUR,KLOR-CON) 20 MEQ tablet Take 1 tablet (20 mEq total) by mouth daily.  90 tablet  1  . raloxifene (EVISTA) 60 MG tablet Take 60 mg by mouth daily.        . ranitidine (ZANTAC) 150 MG tablet Take 150 mg by mouth 2 (two) times daily.          Past Medical History  Diagnosis Date  . Arrhythmia   . CHF (congestive heart failure)   . Dementia   . Osteoporosis   . Hypertension   . GERD (gastroesophageal reflux disease)   . Anemia   . Blood transfusion     Past Surgical History  Procedure Date  . Hip fracture surgery   . Appendectomy     AVW:[id number] of systems complete and found to be negative unless listed above  PHYSICAL EXAM BP 149/82  Pulse 71  Ht 5\' 11"   (1.803 m)  Wt 219 lb 1.9 oz (99.392 kg)  BMI 30.56 kg/m2  General: Well developed, well nourished, in no acute distress Head: Eyes PERRLA, No xanthomas.   Normal cephalic and atramatic  Lungs: Clear bilaterally to auscultation with mild crackles in the bases. Heart: HRIR S1 S2, without MRG.  Pulses are 2+ & equal.            No carotid bruit. No JVD.  No abdominal bruits. No femoral bruits. Abdomen: Bowel sounds are positive, abdomen soft and non-tender without masses or                  Hernia's noted. Msk:  Back with significant kyphosis, slow gait. Diminished strength and tone for age. Extremities: Positive for clubbing, without cyanosis or edema.  DP +1 Neuro: Alert and oriented X 3. Psych:  Good affect, responds appropriately    ASSESSMENT AND PLAN

## 2011-11-14 ENCOUNTER — Telehealth: Payer: Self-pay | Admitting: Adult Health

## 2011-11-14 NOTE — Telephone Encounter (Signed)
FYI : Patient's HR was 50 on 8/20 checked by [facility].  BP was 158/72. Patient's daughter states that patient was is good spirits. / tg

## 2011-11-14 NOTE — Telephone Encounter (Signed)
Thank you :)

## 2011-11-20 ENCOUNTER — Other Ambulatory Visit (HOSPITAL_COMMUNITY): Payer: Self-pay | Admitting: Internal Medicine

## 2011-11-20 DIAGNOSIS — N63 Unspecified lump in unspecified breast: Secondary | ICD-10-CM

## 2011-11-29 ENCOUNTER — Other Ambulatory Visit (HOSPITAL_COMMUNITY): Payer: Self-pay | Admitting: Internal Medicine

## 2011-11-29 ENCOUNTER — Ambulatory Visit (HOSPITAL_COMMUNITY)
Admission: RE | Admit: 2011-11-29 | Discharge: 2011-11-29 | Disposition: A | Discharge status: Home / Self Care | Payer: Medicare Other | Source: Ambulatory Visit | Attending: Internal Medicine | Admitting: Internal Medicine

## 2011-11-29 DIAGNOSIS — N6320 Unspecified lump in the left breast, unspecified quadrant: Secondary | ICD-10-CM

## 2011-11-29 DIAGNOSIS — N63 Unspecified lump in unspecified breast: Secondary | ICD-10-CM

## 2011-12-06 ENCOUNTER — Other Ambulatory Visit (HOSPITAL_COMMUNITY): Payer: Self-pay | Admitting: Internal Medicine

## 2011-12-06 ENCOUNTER — Ambulatory Visit (HOSPITAL_COMMUNITY)
Admission: RE | Admit: 2011-12-06 | Discharge: 2011-12-06 | Disposition: A | Discharge status: Home / Self Care | Payer: Medicare Other | Source: Ambulatory Visit | Attending: Internal Medicine | Admitting: Internal Medicine

## 2011-12-06 DIAGNOSIS — N6049 Mammary duct ectasia of unspecified breast: Secondary | ICD-10-CM | POA: Insufficient documentation

## 2011-12-06 DIAGNOSIS — N63 Unspecified lump in unspecified breast: Secondary | ICD-10-CM | POA: Insufficient documentation

## 2011-12-06 DIAGNOSIS — N6320 Unspecified lump in the left breast, unspecified quadrant: Secondary | ICD-10-CM

## 2011-12-06 NOTE — Progress Notes (Signed)
Lidocaine 2%          9mL                        Left breast biopsy

## 2012-04-09 ENCOUNTER — Ambulatory Visit (HOSPITAL_COMMUNITY): Payer: Medicare Other | Admitting: Oncology

## 2012-04-10 ENCOUNTER — Encounter: Payer: Self-pay | Admitting: Physician Assistant

## 2012-04-10 ENCOUNTER — Ambulatory Visit (INDEPENDENT_AMBULATORY_CARE_PROVIDER_SITE_OTHER): Payer: Medicare Other | Admitting: Physician Assistant

## 2012-04-10 VITALS — BP 162/90 | HR 74 | Ht 70.5 in | Wt 224.0 lb

## 2012-04-10 DIAGNOSIS — I509 Heart failure, unspecified: Secondary | ICD-10-CM

## 2012-04-10 DIAGNOSIS — I5032 Chronic diastolic (congestive) heart failure: Secondary | ICD-10-CM

## 2012-04-10 DIAGNOSIS — Z9889 Other specified postprocedural states: Secondary | ICD-10-CM | POA: Insufficient documentation

## 2012-04-10 DIAGNOSIS — R32 Unspecified urinary incontinence: Secondary | ICD-10-CM | POA: Insufficient documentation

## 2012-04-10 DIAGNOSIS — I482 Chronic atrial fibrillation, unspecified: Secondary | ICD-10-CM

## 2012-04-10 DIAGNOSIS — I4891 Unspecified atrial fibrillation: Secondary | ICD-10-CM

## 2012-04-10 DIAGNOSIS — I10 Essential (primary) hypertension: Secondary | ICD-10-CM

## 2012-04-10 MED ORDER — FUROSEMIDE 20 MG PO TABS: 20.0000 mg | ORAL_TABLET | Freq: Two times a day (BID) | ORAL | Status: DC

## 2012-04-10 MED ORDER — LISINOPRIL 10 MG PO TABS: 10.0000 mg | ORAL_TABLET | Freq: Every day | ORAL | Status: AC

## 2012-04-10 MED ORDER — POTASSIUM CHLORIDE CRYS ER 20 MEQ PO TBCR: 20.0000 meq | EXTENDED_RELEASE_TABLET | Freq: Every day | ORAL | Status: DC

## 2012-04-10 NOTE — Assessment & Plan Note (Signed)
Patient family chose not to have her rest cyst removed because of her comorbidities. It has enlarged since her biopsy and they are concerned and wants a second opinion. They will see Dr. Jerelyn Scott.

## 2012-04-10 NOTE — Assessment & Plan Note (Signed)
No evidence of heart failure on exam today. 

## 2012-04-10 NOTE — Assessment & Plan Note (Signed)
Blood pressure is elevated today but was low when she saw her primary physician this morning. They checked her regularly at home and it has been stable. I will not adjust her medications. She has had a busy day today.

## 2012-04-10 NOTE — Progress Notes (Signed)
HPI:  This is a 79 year old female patient of Dr. Dietrich Pates was here for ongoing assessment and treatment of atrial fibrillation, not a Coumadin candidate secondary to GI bleed and significant deconditioning. She has a history of COPD with O2 dependence,history of hypertension with some diastolic dysfunction.  The patient comes in today for with multiple complaints but none of which are cardiac. She denies any chest pain, palpitations, dyspnea, dyspnea on exertion, dizziness, edema, or presyncope. They saw Dr. Ernie Hew this morning and her blood pressure was 106/82. It is much higher here today. She had a breast biopsy back in September that was benign but the daughter states that eating larger and sheetlike a second opinion. They chose not to have her remove back then because of her age and comorbidities. She also has daily watery stools for which she was given a prescription for Lomotil. Her biggest complaint is urinary incontinence. Her Detrol LA was stopped because of her potassium supplements. She wears depends and has all kinds of padding on the bed but she still leaks through everything several times a day. She was to see Dr. Jerelyn Scott for a second opinion on a breast biopsy but was told she needed blood work prior to that visit.  Allergies: -- Penicillins   -- Sulfa Antibiotics -- Itching  Current Outpatient Prescriptions on File Prior to Visit: aspirin 81 MG tablet, Take 81 mg by mouth daily.  , Disp: , Rfl:  Calcium Carb-Cholecalciferol (CALCIUM PLUS VITAMIN D3) 600-500 MG-UNIT CAPS, Take 1 capsule by mouth daily.  , Disp: , Rfl:  donepezil (ARICEPT) 10 MG tablet, Take 10 mg by mouth at bedtime.  , Disp: , Rfl:  folic acid (FOLVITE) 1 MG tablet, Take 1 mg by mouth daily.  , Disp: , Rfl:   furosemide (LASIX) 20 MG tablet, Take 1 tablet (20 mg total) by mouth 2 (two) times daily., Disp: 180 tablet, Rfl: 3 lisinopril (PRINIVIL,ZESTRIL) 10 MG tablet, Take 1 tablet (10 mg total) by mouth daily.,  Disp: 90 tablet, Rfl: 3 memantine (NAMENDA) 10 MG tablet, Take 10 mg by mouth daily.  , Disp: , Rfl:  potassium chloride SA (K-DUR,KLOR-CON) 20 MEQ tablet, Take 1 tablet (20 mEq total) by mouth daily., Disp: 90 tablet, Rfl: 3 raloxifene (EVISTA) 60 MG tablet, Take 60 mg by mouth daily.  , Disp: , Rfl:  ranitidine (ZANTAC) 150 MG tablet, Take 150 mg by mouth 2 (two) times daily.  , Disp: , Rfl:     Past Medical History:   Arrhythmia                                                   CHF (congestive heart failure)                               Dementia                                                     Osteoporosis  Hypertension                                                 GERD (gastroesophageal reflux disease)                       Anemia                                                       Blood transfusion                                           Past Surgical History:   HIP FRACTURE SURGERY                                         APPENDECTOMY                                                Review of patient's family history indicates:   Heart disease                  Mother                   Heart disease                  Father                   Social History   Marital Status: Widowed             Spouse Name:                      Years of Education:                 Number of children:             Occupational History   None on file  Social History Main Topics   Smoking Status: Former Smoker                   Packs/Day:       Years:         Smokeless Status: Never Used                       Alcohol Use: No             Drug Use: No             Sexual Activity: No                 Other Topics            Concern   None on file  Social History Narrative   None on file    ROS:see history of present illness otherwise negative   PHYSICAL EXAM: Well-nournished,  in no acute distress. Neck: No JVD, HJR, Bruit, or thyroid  enlargement  Lungs: No tachypnea, clear without wheezing, rales, or rhonchi  Cardiovascular: irregular irregular, PMI not displaced, 1-2/6 systolic ejection murmur at the left sternal border, no bruit, thrill, or heave.  Abdomen: BS normal. Soft without organomegaly, masses, lesions or tenderness.  Extremities: without cyanosis, clubbing or edema. Good distal pulses bilateral  SKin: Warm, no lesions or rashes   Musculoskeletal: No deformities  Neuro: no focal signs  BP 162/90  Pulse 74  Ht 5' 10.5" (1.791 m)  Wt 224 lb (101.606 kg)  BMI 31.69 kg/m2  SpO2 91%    ZOX:WRUEAV fibrillation with controlled ventricular rate  2Decho:10/17/10: Study Conclusions  - Left ventricle: The cavity size was normal. There was mild   concentric hypertrophy. Systolic function was normal. The   estimated ejection fraction was in the range of 60% to 65%. Wall   motion was normal; there were no regional wall motion   abnormalities. - Left atrium: The atrium was moderately dilated. - Right atrium: The atrium was moderately to severely dilated. - Atrial septum: No defect or patent foramen ovale was identified. - Pulmonary arteries: Systolic pressure was mildly increased. - Pericardium, extracardiac: There was a moderate-sized left pleural   effusion. Transthoracic echocardiography. M-mode, complete 2D, spectral Doppler, and color Doppler. Height: Height: 188cm. Height: 74in. Weight: Weight: 86.6kg. Weight: 190.6lb. Body mass index: BMI: 24.5kg/m^2. Body surface area

## 2012-04-10 NOTE — Patient Instructions (Addendum)
Your physician recommends that you schedule a follow-up appointment in: 6 months  

## 2012-04-10 NOTE — Assessment & Plan Note (Signed)
Controlled rate. No Coumadin do to GI bleed. Stable

## 2012-04-15 ENCOUNTER — Telehealth: Payer: Self-pay | Admitting: Physician Assistant

## 2012-04-15 NOTE — Telephone Encounter (Signed)
Needs Lasix 90 day supply sent to [facility].Marland Kitchen   tgs

## 2012-04-16 ENCOUNTER — Other Ambulatory Visit: Payer: Self-pay | Admitting: *Deleted

## 2012-04-16 MED ORDER — FUROSEMIDE 20 MG PO TABS: 20.0000 mg | ORAL_TABLET | Freq: Two times a day (BID) | ORAL | Status: DC

## 2012-05-11 ENCOUNTER — Other Ambulatory Visit: Payer: Self-pay

## 2012-06-28 ENCOUNTER — Encounter (HOSPITAL_COMMUNITY): Payer: Self-pay | Admitting: Emergency Medicine

## 2012-06-28 ENCOUNTER — Emergency Department (HOSPITAL_COMMUNITY): Payer: Medicare Other

## 2012-06-28 ENCOUNTER — Emergency Department (HOSPITAL_COMMUNITY)
Admission: EM | Admit: 2012-06-28 | Discharge: 2012-06-28 | Disposition: A | Discharge status: Home / Self Care | Payer: Medicare Other | Attending: Emergency Medicine | Admitting: Emergency Medicine

## 2012-06-28 DIAGNOSIS — Z87891 Personal history of nicotine dependence: Secondary | ICD-10-CM | POA: Insufficient documentation

## 2012-06-28 DIAGNOSIS — F0390 Unspecified dementia without behavioral disturbance: Secondary | ICD-10-CM | POA: Insufficient documentation

## 2012-06-28 DIAGNOSIS — R142 Eructation: Secondary | ICD-10-CM | POA: Insufficient documentation

## 2012-06-28 DIAGNOSIS — M810 Age-related osteoporosis without current pathological fracture: Secondary | ICD-10-CM | POA: Insufficient documentation

## 2012-06-28 DIAGNOSIS — Z8679 Personal history of other diseases of the circulatory system: Secondary | ICD-10-CM | POA: Insufficient documentation

## 2012-06-28 DIAGNOSIS — I10 Essential (primary) hypertension: Secondary | ICD-10-CM | POA: Insufficient documentation

## 2012-06-28 DIAGNOSIS — R141 Gas pain: Secondary | ICD-10-CM | POA: Insufficient documentation

## 2012-06-28 DIAGNOSIS — Z7982 Long term (current) use of aspirin: Secondary | ICD-10-CM | POA: Insufficient documentation

## 2012-06-28 DIAGNOSIS — K219 Gastro-esophageal reflux disease without esophagitis: Secondary | ICD-10-CM | POA: Insufficient documentation

## 2012-06-28 DIAGNOSIS — Z79899 Other long term (current) drug therapy: Secondary | ICD-10-CM | POA: Insufficient documentation

## 2012-06-28 DIAGNOSIS — R140 Abdominal distension (gaseous): Secondary | ICD-10-CM

## 2012-06-28 LAB — COMPREHENSIVE METABOLIC PANEL
AST: 19 U/L (ref 0–37)
Albumin: 3.5 g/dL (ref 3.5–5.2)
Alkaline Phosphatase: 76 U/L (ref 39–117)
BUN: 14 mg/dL (ref 6–23)
CO2: 34 mEq/L — ABNORMAL HIGH (ref 19–32)
Chloride: 101 mEq/L (ref 96–112)
GFR calc non Af Amer: 76 mL/min — ABNORMAL LOW (ref >90)
Potassium: 3.7 mEq/L (ref 3.5–5.1)
Total Bilirubin: 0.8 mg/dL (ref 0.3–1.2)

## 2012-06-28 LAB — CBC WITH DIFFERENTIAL/PLATELET
Basophils Absolute: 0 10*3/uL (ref 0.0–0.1)
Basophils Relative: 0 % (ref 0–1)
HCT: 38.3 % (ref 36.0–46.0)
Hemoglobin: 11.9 g/dL — ABNORMAL LOW (ref 12.0–15.0)
Lymphocytes Relative: 19 % (ref 12–46)
MCHC: 31.1 g/dL (ref 30.0–36.0)
Monocytes Absolute: 0.9 10*3/uL (ref 0.1–1.0)
Monocytes Relative: 14 % — ABNORMAL HIGH (ref 3–12)
Neutro Abs: 4.1 10*3/uL (ref 1.7–7.7)
Neutrophils Relative %: 65 % (ref 43–77)
WBC: 6.4 10*3/uL (ref 4.0–10.5)

## 2012-06-28 MED ORDER — PEG 3350-KCL-NABCB-NACL-NASULF 236 G PO SOLR: 4.0000 L | Freq: Once | ORAL | Status: DC

## 2012-06-28 MED ORDER — IOHEXOL 300 MG/ML  SOLN
50.0000 mL | Freq: Once | INTRAMUSCULAR | Status: AC | PRN
  Administered 2012-06-28: 50 mL via ORAL

## 2012-06-28 MED ORDER — IOHEXOL 300 MG/ML  SOLN
100.0000 mL | Freq: Once | INTRAMUSCULAR | Status: AC | PRN
  Administered 2012-06-28: 100 mL via INTRAVENOUS

## 2012-06-28 NOTE — ED Provider Notes (Signed)
History    This chart was scribed for Dione Booze, MD by Charolett Bumpers, ED Scribe. The patient was seen in room APA03/APA03. Patient's care was started at 10:28.   CSN: [id number]  Arrival date & time 06/28/12  1008   First MD Initiated Contact with Patient 06/28/12 1028      Chief Complaint  Patient presents with  . Constipation   Level V Caveat: Hx and ROS limited due to h/o Dementia  The history is provided by a relative. The history is limited by the condition of the patient. No language interpreter was used.   Danielle Walker is a 79 y.o. female who presents to the Emergency Department complaining of constipation. Family states that the pt has been having gritty stools for the past several weeks. Daughters reports associated abdominal distension that they noticed earlier this week. They have given her stool softeners and fiber without relief. They denies any nausea or vomiting. They are concerned that she may be impacted.   PCP: Dr. Regino Schultze   Past Medical History  Diagnosis Date  . Arrhythmia   . CHF (congestive heart failure)   . Dementia   . Osteoporosis   . Hypertension   . GERD (gastroesophageal reflux disease)   . Anemia   . Blood transfusion     Past Surgical History  Procedure Laterality Date  . Hip fracture surgery    . Appendectomy      Family History  Problem Relation Age of Onset  . Heart disease Mother   . Heart disease Father     History  Substance Use Topics  . Smoking status: Former Games developer  . Smokeless tobacco: Never Used  . Alcohol Use: No    OB History   Grav Para Term Preterm Abortions TAB SAB Ect Mult Living                  Review of Systems  Unable to perform ROS: Dementia    Allergies  Penicillins and Sulfa antibiotics  Home Medications   Current Outpatient Rx  Name  Route  Sig  Dispense  Refill  . aspirin 81 MG tablet   Oral   Take 81 mg by mouth daily.           . Calcium Carb-Cholecalciferol (CALCIUM  PLUS VITAMIN D3) 600-500 MG-UNIT CAPS   Oral   Take 1 capsule by mouth daily.           . diphenhydramine-acetaminophen (TYLENOL PM) 25-500 MG TABS   Oral   Take 1 tablet by mouth at bedtime as needed.         . donepezil (ARICEPT) 10 MG tablet   Oral   Take 10 mg by mouth at bedtime.           . folic acid (FOLVITE) 1 MG tablet   Oral   Take 1 mg by mouth daily.           . furosemide (LASIX) 20 MG tablet   Oral   Take 1 tablet (20 mg total) by mouth 2 (two) times daily.   180 tablet   3   . lisinopril (PRINIVIL,ZESTRIL) 10 MG tablet   Oral   Take 1 tablet (10 mg total) by mouth daily.   90 tablet   3   . memantine (NAMENDA) 10 MG tablet   Oral   Take 10 mg by mouth daily.           . potassium chloride SA (  K-DUR,KLOR-CON) 20 MEQ tablet   Oral   Take 1 tablet (20 mEq total) by mouth daily.   90 tablet   3   . raloxifene (EVISTA) 60 MG tablet   Oral   Take 60 mg by mouth daily.           . ranitidine (ZANTAC) 150 MG tablet   Oral   Take 150 mg by mouth 2 (two) times daily.             BP 143/51  Pulse 81  Temp(Src) 98.3 F (36.8 C) (Oral)  Resp 18  Ht 5\' 10"  (1.778 m)  Wt 222 lb (100.699 kg)  BMI 31.85 kg/m2  SpO2 97%  Physical Exam  Nursing note and vitals reviewed. Constitutional: She appears well-developed and well-nourished. No distress.  HENT:  Head: Normocephalic and atraumatic.  Eyes: EOM are normal. Pupils are equal, round, and reactive to light.  Neck: Normal range of motion. Neck supple. No tracheal deviation present.  Cardiovascular: Normal rate, regular rhythm and normal heart sounds.   Pulmonary/Chest: Effort normal and breath sounds normal. No respiratory distress.  Abdominal: She exhibits distension. There is no tenderness.  Abdomen is distended, tympanitic but non-tender.   Genitourinary:  No impaction, stool normal color and hemoccult negative. Chaperon present.    Musculoskeletal: Normal range of motion. She  exhibits no edema.  Neurological: She is alert.  Skin: Skin is warm and dry.  Psychiatric: She has a normal mood and affect. Her behavior is normal.    ED Course  Procedures (including critical care time)  DIAGNOSTIC STUDIES: Oxygen Saturation is 97% on 2 L [place], adequate by my interpretation.    COORDINATION OF CARE:  10:52 AM-Performed rectal exam with chaperon present, hemoccult negative. Discussed planned course of treatment with the family, including x-ray of her abdomen, who are agreeable at this time.    Results for orders placed during the hospital encounter of 06/28/12  CBC WITH DIFFERENTIAL      Result Value Range   WBC 6.4  4.0 - 10.5 K/uL   RBC 3.86 (*) 3.87 - 5.11 MIL/uL   Hemoglobin 11.9 (*) 12.0 - 15.0 g/dL   HCT 16.1  09.6 - 04.5 %   MCV 99.2  78.0 - 100.0 fL   MCH 30.8  26.0 - 34.0 pg   MCHC 31.1  30.0 - 36.0 g/dL   RDW 40.9  81.1 - 91.4 %   Platelets 82 (*) 150 - 400 K/uL   Neutrophils Relative 65  43 - 77 %   Neutro Abs 4.1  1.7 - 7.7 K/uL   Lymphocytes Relative 19  12 - 46 %   Lymphs Abs 1.2  0.7 - 4.0 K/uL   Monocytes Relative 14 (*) 3 - 12 %   Monocytes Absolute 0.9  0.1 - 1.0 K/uL   Eosinophils Relative 2  0 - 5 %   Eosinophils Absolute 0.1  0.0 - 0.7 K/uL   Basophils Relative 0  0 - 1 %   Basophils Absolute 0.0  0.0 - 0.1 K/uL  COMPREHENSIVE METABOLIC PANEL      Result Value Range   Sodium 142  135 - 145 mEq/L   Potassium 3.7  3.5 - 5.1 mEq/L   Chloride 101  96 - 112 mEq/L   CO2 34 (*) 19 - 32 mEq/L   Glucose, Bld 136 (*) 70 - 99 mg/dL   BUN 14  6 - 23 mg/dL   Creatinine, Ser 7.82  0.50 -  1.10 mg/dL   Calcium 9.5  8.4 - 40.9 mg/dL   Total Protein 7.3  6.0 - 8.3 g/dL   Albumin 3.5  3.5 - 5.2 g/dL   AST 19  0 - 37 U/L   ALT 7  0 - 35 U/L   Alkaline Phosphatase 76  39 - 117 U/L   Total Bilirubin 0.8  0.3 - 1.2 mg/dL   GFR calc non Af Amer 76 (*) >90 mL/min   GFR calc Af Amer 88 (*) >90 mL/min   Ct Abdomen Pelvis W Contrast  06/28/2012   *RADIOLOGY REPORT*  Clinical Data: Abdominal distention.  CT ABDOMEN AND PELVIS WITH CONTRAST  Technique:  Multidetector CT imaging of the abdomen and pelvis was performed following the standard protocol during bolus administration of intravenous contrast.  Contrast: 50mL OMNIPAQUE IOHEXOL 300 MG/ML  SOLN, OMNIPAQUE IOHEXOL 300 MG/ML  SOLN  Comparison: Two-view abdomen 06/28/2012.  Findings: Mild dependent atelectasis is present bilaterally.  No significant airspace consolidation is evident.  The heart is enlarged.  There is no significant pleural or pericardial effusion.  Benign appearing cysts are present within the liver.  A less well defined lesion contains punctate calcifications within the right lobe of the liver. This may be along an accessory fissure.  It could be related prior infection or trauma.  Mild fatty infiltration of the liver is suggested.  Tortuous vessels are present within the splenic hilum compatible with a spleno-renal shunt.  The spleen is otherwise within normal limits. The pancreas is diffusely atrophic.  The common bile duct is within normal limits for age.  Layering stones are present in the gallbladder without evidence for inflammation.  The adrenal glands are normal bilaterally.  The kidneys and ureters are unremarkable.  Atherosclerotic calcifications are present in the aorta without aneurysm.  The proximal sigmoid colon is markedly distended, measuring up to 8 cm.  There is smooth tapering of the sigmoid colon without a discrete mass.  Minimal fluid is present at the rectum.  The more proximal sigmoid and descending colon is of normal caliber.  The ascending colon is unremarkable.  The appendix is not discretely visualized and may be surgically absent.  Small bowel is unremarkable.  No significant adenopathy is present.  The bone windows demonstrate mild leftward curvature of the lumbar spine, centered at L4-5.  Anterolisthesis at L4-5 measures 8 mm.  A vacuum disc is in place.   Degenerative facet changes are present.  IMPRESSION:  1.  Focally dilated loop of sigmoid colon.  There is smooth tapering just below this level.  No discrete mass lesion is evident although neoplasm is not entirely excluded.  Distal endoscopy may be useful for further evaluation. 2.  The more proximal colon and small bowel are within normal limits. 3.  Atherosclerosis. 4.  Spleno-renal shunt. 5.  Calcified lesion within the right lobe the liver is nonspecific but appears benign.  This may be related to prior infection or trauma. 6.  Probable fatty infiltration of the liver. 7.  Cholelithiasis without evidence for cholecystitis.   Original Report Authenticated By: Marin Roberts, M.D.    Dg Abd 2 Views  06/28/2012  *RADIOLOGY REPORT*  Clinical Data: Constipation.  Abdominal distention.  ABDOMEN - 2 VIEW  Comparison: None.  Findings: Mild distention of the colon is seen mainly involving the sigmoid colon and possibly the cecum.  A moderate amount of stool is seen.  No evidence of bowel obstruction or air fluid levels.  No evidence of  free air.  No radiopaque calculi identified.  IMPRESSION: Nonobstructive bowel gas pattern.  Moderate stool burden.   Original Report Authenticated By: Myles Rosenthal, M.D.     Images viewed by me.   1. Abdominal distension       MDM  Abdominal distention without significant tenderness. Plain films will be obtained to evaluate.  Abdominal x-rays show dilated sigmoid bowel loop 8 cm in Amer. She'll be sent for CT to make sure there is no serious intra-abdominal pathology.  CT has come back without evidence of perforation and without evidence of bowel and any dangerous degree of distention. There is no evidence of impaction. She will be discharged with a prescription for polyethylene glycol to try and clear his system out and family is given constipation instructions.  I personally performed the services described in this documentation, which was scribed in my  presence. The recorded information has been reviewed and is accurate.        Dione Booze, MD 06/28/12 [phone]

## 2012-06-28 NOTE — ED Notes (Signed)
Pt dirnking contrast.  

## 2012-06-28 NOTE — ED Notes (Signed)
Patient with no complaints at this time. Respirations even and unlabored. Skin warm/dry. Discharge instructions reviewed with patient at this time. Patient given opportunity to voice concerns/ask questions. IV removed per policy and band-aid applied to site. Patient discharged at this time and left Emergency Department via w/c.

## 2012-06-28 NOTE — ED Notes (Signed)
Pt family states pt may be impacted with stool.

## 2012-06-28 NOTE — ED Notes (Signed)
Pt finished contrast. Nad. No change

## 2012-10-30 ENCOUNTER — Other Ambulatory Visit: Payer: Self-pay

## 2012-12-24 ENCOUNTER — Encounter: Payer: Self-pay | Admitting: Adult Health

## 2012-12-24 ENCOUNTER — Ambulatory Visit (INDEPENDENT_AMBULATORY_CARE_PROVIDER_SITE_OTHER): Payer: Medicare Other | Admitting: Adult Health

## 2012-12-24 VITALS — BP 122/64 | HR 68 | Ht 72.0 in | Wt 220.0 lb

## 2012-12-24 DIAGNOSIS — I482 Chronic atrial fibrillation, unspecified: Secondary | ICD-10-CM

## 2012-12-24 DIAGNOSIS — I4892 Unspecified atrial flutter: Secondary | ICD-10-CM

## 2012-12-24 DIAGNOSIS — I4891 Unspecified atrial fibrillation: Secondary | ICD-10-CM

## 2012-12-24 DIAGNOSIS — I10 Essential (primary) hypertension: Secondary | ICD-10-CM

## 2012-12-24 DIAGNOSIS — I509 Heart failure, unspecified: Secondary | ICD-10-CM

## 2012-12-24 DIAGNOSIS — I5032 Chronic diastolic (congestive) heart failure: Secondary | ICD-10-CM

## 2012-12-24 NOTE — Assessment & Plan Note (Signed)
She is rate controlled. Will continue current medications without rate control agents. She is not a candidate for anticoagulants

## 2012-12-24 NOTE — Progress Notes (Signed)
Name: Danielle Walker    DOB: 06-19-1923  Age: 79 y.o.  MR#: [id number]       PCP:  Kirk Ruths, MD      Insurance: Payor: MEDICARE / Plan: MEDICARE PART A AND B / Product Type: *No Product type* /   CC:    Chief Complaint  Patient presents with  . Hypertension  . Congestive Heart Failure    Diastolic  . Atrial Fibrillation    VS Filed Vitals:   12/24/12 1334  BP: 122/64  Pulse: 68  Height: 6' (1.829 m)  Weight: 220 lb (99.791 kg)    Weights Current Weight  12/24/12 220 lb (99.791 kg)  06/28/12 222 lb (100.699 kg)  04/10/12 224 lb (101.606 kg)    Blood Pressure  BP Readings from Last 3 Encounters:  12/24/12 122/64  06/28/12 155/65  04/10/12 162/90     Admit date:  (Not on file) Last encounter with RMR:  Visit date not found   Allergy Penicillins and Sulfa antibiotics  Current Outpatient Prescriptions  Medication Sig Dispense Refill  . aspirin 81 MG tablet Take 81 mg by mouth daily.        . Calcium Carb-Cholecalciferol (CALCIUM PLUS VITAMIN D3) 600-500 MG-UNIT CAPS Take 1 capsule by mouth daily.        . clotrimazole-betamethasone (LOTRISONE) cream       . diphenhydramine-acetaminophen (TYLENOL PM) 25-500 MG TABS Take 1 tablet by mouth at bedtime as needed (sleep).       . donepezil (ARICEPT) 10 MG tablet Take 10 mg by mouth at bedtime.        . folic acid (FOLVITE) 1 MG tablet Take 1 mg by mouth daily.        . furosemide (LASIX) 20 MG tablet Take 1 tablet (20 mg total) by mouth 2 (two) times daily.  180 tablet  3  . lisinopril (PRINIVIL,ZESTRIL) 10 MG tablet Take 1 tablet (10 mg total) by mouth daily.  90 tablet  3  . memantine (NAMENDA) 10 MG tablet Take 10 mg by mouth daily.        . mupirocin ointment (BACTROBAN) 2 %       . polyethylene glycol (GOLYTELY) 236 G solution Take 4,000 mLs by mouth once. Drink 8 ounces every 15 minutes until your passing clear liquid  4000 mL  0  . potassium chloride SA (K-DUR,KLOR-CON) 20 MEQ tablet Take 1 tablet (20 mEq  total) by mouth daily.  90 tablet  3  . raloxifene (EVISTA) 60 MG tablet Take 60 mg by mouth daily.        . ranitidine (ZANTAC) 150 MG tablet Take 150 mg by mouth 2 (two) times daily.        . traZODone (DESYREL) 50 MG tablet Take 50 mg by mouth at bedtime.        No current facility-administered medications for this visit.    Discontinued Meds:   There are no discontinued medications.  Patient Active Problem List   Diagnosis Date Noted  . Urinary incontinence 04/10/2012  . S/P breast biopsy 04/10/2012  . Bleeding from the nose 04/27/2011  . Atrial flutter 10/15/2010  . HTN (hypertension), benign 10/15/2010  . Diastolic CHF, chronic 09/30/2010  . Generalized abdominal swelling 09/19/2010  . Chronic atrial fibrillation 09/19/2010    LABS    Component Value Date/Time   NA 142 06/28/2012 1154   NA 143 10/13/2011 1129   NA 140 04/27/2011 1149   K 3.7 06/28/2012 1154  K 4.0 10/13/2011 1129   K 4.5 04/27/2011 1149   CL 101 06/28/2012 1154   CL 105 10/13/2011 1129   CL 99 04/27/2011 1149   CO2 34* 06/28/2012 1154   CO2 32 10/13/2011 1129   CO2 34* 04/27/2011 1149   GLUCOSE 136* 06/28/2012 1154   GLUCOSE 100* 10/13/2011 1129   GLUCOSE 104* 04/27/2011 1149   BUN 14 06/28/2012 1154   BUN 15 10/13/2011 1129   BUN 25* 04/27/2011 1149   CREATININE 0.72 06/28/2012 1154   CREATININE 0.81 10/13/2011 1129   CREATININE 0.82 04/27/2011 1149   CREATININE 0.70 10/26/2010 1345   CREATININE <0.47* 10/15/2010 0610   CREATININE 0.68 10/14/2010 1516   CALCIUM 9.5 06/28/2012 1154   CALCIUM 9.7 10/13/2011 1129   CALCIUM 9.6 04/27/2011 1149   GFRNONAA 76* 06/28/2012 1154   GFRNONAA NOT CALCULATED 10/15/2010 0610   GFRNONAA >60 10/14/2010 1516   GFRAA 88* 06/28/2012 1154   GFRAA NOT CALCULATED 10/15/2010 0610   GFRAA >60 10/14/2010 1516   CMP     Component Value Date/Time   NA 142 06/28/2012 1154   K 3.7 06/28/2012 1154   CL 101 06/28/2012 1154   CO2 34* 06/28/2012 1154   GLUCOSE 136* 06/28/2012 1154   BUN 14 06/28/2012 1154    CREATININE 0.72 06/28/2012 1154   CREATININE 0.81 10/13/2011 1129   CALCIUM 9.5 06/28/2012 1154   PROT 7.3 06/28/2012 1154   ALBUMIN 3.5 06/28/2012 1154   AST 19 06/28/2012 1154   ALT 7 06/28/2012 1154   ALKPHOS 76 06/28/2012 1154   BILITOT 0.8 06/28/2012 1154   GFRNONAA 76* 06/28/2012 1154   GFRAA 88* 06/28/2012 1154       Component Value Date/Time   WBC 6.4 06/28/2012 1154   WBC 6.6 10/13/2011 1129   WBC 6.2 04/27/2011 1149   HGB 11.9* 06/28/2012 1154   HGB 12.5 10/13/2011 1129   HGB 12.4 04/27/2011 1149   HCT 38.3 06/28/2012 1154   HCT 38.8 10/13/2011 1129   HCT 40.0 04/27/2011 1149   MCV 99.2 06/28/2012 1154   MCV 93.5 10/13/2011 1129   MCV 100.0 04/27/2011 1149    Lipid Panel  No results found for this basename: chol, trig, hdl, cholhdl, vldl, ldlcalc    ABG No results found for this basename: phart, pco2, pco2art, po2, po2art, hco3, tco2, acidbasedef, o2sat     Lab Results  Component Value Date   TSH 1.183 10/15/2010   BNP (last 3 results) No results found for this basename: PROBNP,  in the last 8760 hours Cardiac Panel (last 3 results) No results found for this basename: CKTOTAL, CKMB, TROPONINI, RELINDX,  in the last 72 hours  Iron/TIBC/Ferritin    Component Value Date/Time   IRON 64 02/02/2009 1142   TIBC 314 02/02/2009 1142   FERRITIN 162 02/02/2009 1142     EKG Orders placed in visit on 12/24/12  . EKG 12-LEAD     Prior Assessment and Plan Problem List as of 12/24/2012   Diastolic CHF, chronic   Last Assessment & Plan   04/10/2012 Office Visit Written 04/10/2012  2:13 PM by Dyann Kief, PA     No evidence of heart failure on exam today.    HTN (hypertension), benign   Last Assessment & Plan   04/10/2012 Office Visit Written 04/10/2012  2:13 PM by Dyann Kief, PA     Blood pressure is elevated today but was low when she saw her primary physician this morning. They  checked her regularly at home and it has been stable. I will not adjust her medications. She has had a busy day  today.    Generalized abdominal swelling   Chronic atrial fibrillation   Last Assessment & Plan   04/10/2012 Office Visit Written 04/10/2012  2:12 PM by Dyann Kief, PA     Controlled rate. No Coumadin do to GI bleed. Stable    Atrial flutter   Last Assessment & Plan   10/26/2010 Office Visit Written 10/26/2010  2:00 PM by Dyann Kief, PA     Patient has slow atrial flutter of 52 beats per minute. Her Lanoxin was stopped. She is not on any  rate lowering medications at this time. We will continue to monitor her. She may need a pacemaker in the future.    Bleeding from the nose   Last Assessment & Plan   04/27/2011 Office Visit Written 04/27/2011 12:24 PM by Jodelle Gross, NP     I suspect that this is related to her use of [place] O2. I have examined her nares with light and found a sore on the nasal septum with bright red and dried blood in the right nares. I have given her a Rx for NaSol solution, Z-Pack to treat for bacterial infection and topical neosporin.     Urinary incontinence   S/P breast biopsy   Last Assessment & Plan   04/10/2012 Office Visit Written 04/10/2012  2:15 PM by Dyann Kief, PA     Patient family chose not to have her rest cyst removed because of her comorbidities. It has enlarged since her biopsy and they are concerned and wants a second opinion. They will see Dr. Jerelyn Scott.        Imaging: No results found.

## 2012-12-24 NOTE — Assessment & Plan Note (Signed)
There is no evidence of fluid overload on exam. She is very inactive and has chronic O2 dependence. I will have BMET drawn as she has not had labs in 6 months and is on ACE and diuretic for kidney fx.

## 2012-12-24 NOTE — Patient Instructions (Addendum)
   Your physician recommends that you schedule a follow-up appointment in: 6 months. You will receive a reminder letter in the mail in about 4 months reminding you to call and schedule your appointment. If you don't receive this letter, please contact our office. Your physician recommends that you continue on your current medications as directed. Please refer to the Current Medication list given to you today. Your physician recommends that you have lab work next week at [facility] for [employer].

## 2012-12-24 NOTE — Assessment & Plan Note (Signed)
Excellent control of BP at this time. Continue medications as directed.

## 2012-12-24 NOTE — Progress Notes (Signed)
HPI: [name] is an 79 y/o former patient of Dr. Dietrich Pates we are seeing for ongoing assessment and management of atrial fib (not a candidate for anticoagulation due to GIB), hypertension, and chronic diastolic CHF. She was last seen in the office on January of 2014 and was stable. The patient was mildly hypertensive but blood pressure medications were continued  without changes as the the patient was quite anxious. There were plans for her to have a breast cyst removed her primary care physician's referral to surgeon. They have now chosen not pursue surgery at this time.    She is without complaint today. Her BP is better. She denies chest pain or DOE, heart racing. She is medically compliant.               Allergies  Allergen Reactions  . Penicillins   . Sulfa Antibiotics Itching    Current Outpatient Prescriptions  Medication Sig Dispense Refill  . aspirin 81 MG tablet Take 81 mg by mouth daily.        . Calcium Carb-Cholecalciferol (CALCIUM PLUS VITAMIN D3) 600-500 MG-UNIT CAPS Take 1 capsule by mouth daily.        . diphenhydramine-acetaminophen (TYLENOL PM) 25-500 MG TABS Take 1 tablet by mouth at bedtime as needed (sleep).       . donepezil (ARICEPT) 10 MG tablet Take 10 mg by mouth at bedtime.        . folic acid (FOLVITE) 1 MG tablet Take 1 mg by mouth daily.        . furosemide (LASIX) 20 MG tablet Take 1 tablet (20 mg total) by mouth 2 (two) times daily.  180 tablet  3  . lisinopril (PRINIVIL,ZESTRIL) 10 MG tablet Take 1 tablet (10 mg total) by mouth daily.  90 tablet  3  . memantine (NAMENDA) 10 MG tablet Take 10 mg by mouth daily.        . polyethylene glycol (GOLYTELY) 236 G solution Take 4,000 mLs by mouth once. Drink 8 ounces every 15 minutes until your passing clear liquid  4000 mL  0  . potassium chloride SA (K-DUR,KLOR-CON) 20 MEQ tablet Take 1 tablet (20 mEq total) by mouth daily.  90 tablet  3  . raloxifene (EVISTA) 60 MG tablet Take 60 mg by mouth daily.        .  ranitidine (ZANTAC) 150 MG tablet Take 150 mg by mouth 2 (two) times daily.         No current facility-administered medications for this visit.    Past Medical History  Diagnosis Date  . Arrhythmia   . CHF (congestive heart failure)   . Dementia   . Osteoporosis   . Hypertension   . GERD (gastroesophageal reflux disease)   . Anemia   . Blood transfusion     Past Surgical History  Procedure Laterality Date  . Hip fracture surgery    . Appendectomy      ROS: Review of systems complete and found to be negative unless listed above  PHYSICAL EXAM There were no vitals taken for this visit.  General: Well developed, well nourished, in no acute distress, wearing O2 Head: Eyes PERRLA, No xanthomas.   Normal cephalic and atramatic  Lungs: Clear bilaterally to auscultation and percussion. Heart: HIRR S1 S2, without MRG.  Pulses are 2+ & equal.            No carotid bruit. No JVD.  No abdominal bruits. No femoral bruits. Abdomen: Bowel  sounds are positive, abdomen soft and non-tender without masses or                  Hernia's noted. Msk:  Back normal, normal gait. Normal strength and tone for age. Extremities: No clubbing, cyanosis or edema.  DP +1 Neuro: Alert and oriented X 3. Psych:  Flat affect, responds appropriately  EKG: Atrial flutter. Rate of 76 bpm.  ASSESSMENT AND PLAN

## 2012-12-25 ENCOUNTER — Encounter: Payer: Self-pay | Admitting: *Deleted

## 2012-12-25 LAB — BASIC METABOLIC PANEL
BUN: 13 mg/dL (ref 6–23)
Calcium: 9.5 mg/dL (ref 8.4–10.5)
Glucose, Bld: 126 mg/dL — ABNORMAL HIGH (ref 70–99)

## 2013-01-30 ENCOUNTER — Other Ambulatory Visit: Payer: Self-pay

## 2013-02-14 ENCOUNTER — Other Ambulatory Visit: Payer: Self-pay | Admitting: Cardiology

## 2013-02-17 ENCOUNTER — Telehealth: Payer: Self-pay | Admitting: *Deleted

## 2013-02-17 NOTE — Telephone Encounter (Signed)
Signed and sent to [facility]

## 2013-02-17 NOTE — Telephone Encounter (Signed)
rx refill for [facility] for lisinopril 10 mg and k-dur 20 meq. Refill sheet in rx bin needs signature

## 2013-03-13 ENCOUNTER — Ambulatory Visit: Payer: Medicare Other | Admitting: Adult Health

## 2013-03-14 ENCOUNTER — Ambulatory Visit (HOSPITAL_COMMUNITY)
Admission: RE | Admit: 2013-03-14 | Discharge: 2013-03-14 | Disposition: A | Discharge status: Home / Self Care | Payer: Medicare Other | Source: Ambulatory Visit | Attending: Adult Health | Admitting: Adult Health

## 2013-03-14 ENCOUNTER — Ambulatory Visit (INDEPENDENT_AMBULATORY_CARE_PROVIDER_SITE_OTHER): Payer: Medicare Other | Admitting: Adult Health

## 2013-03-14 ENCOUNTER — Encounter: Payer: Self-pay | Admitting: Adult Health

## 2013-03-14 VITALS — BP 118/58 | HR 69 | Ht 72.0 in | Wt 235.0 lb

## 2013-03-14 DIAGNOSIS — I4891 Unspecified atrial fibrillation: Secondary | ICD-10-CM

## 2013-03-14 DIAGNOSIS — I10 Essential (primary) hypertension: Secondary | ICD-10-CM

## 2013-03-14 DIAGNOSIS — I509 Heart failure, unspecified: Secondary | ICD-10-CM

## 2013-03-14 DIAGNOSIS — R0602 Shortness of breath: Secondary | ICD-10-CM

## 2013-03-14 DIAGNOSIS — R1907 Generalized intra-abdominal and pelvic swelling, mass and lump: Secondary | ICD-10-CM

## 2013-03-14 DIAGNOSIS — I517 Cardiomegaly: Secondary | ICD-10-CM | POA: Insufficient documentation

## 2013-03-14 DIAGNOSIS — R062 Wheezing: Secondary | ICD-10-CM | POA: Insufficient documentation

## 2013-03-14 DIAGNOSIS — I5032 Chronic diastolic (congestive) heart failure: Secondary | ICD-10-CM

## 2013-03-14 DIAGNOSIS — I482 Chronic atrial fibrillation, unspecified: Secondary | ICD-10-CM

## 2013-03-14 DIAGNOSIS — R0989 Other specified symptoms and signs involving the circulatory and respiratory systems: Secondary | ICD-10-CM | POA: Insufficient documentation

## 2013-03-14 LAB — CBC
HCT: 33.2 % — ABNORMAL LOW (ref 36.0–46.0)
Hemoglobin: 10.6 g/dL — ABNORMAL LOW (ref 12.0–15.0)
MCH: 30.5 pg (ref 26.0–34.0)
MCHC: 31.9 g/dL (ref 30.0–36.0)
RDW: 13.7 % (ref 11.5–15.5)

## 2013-03-14 MED ORDER — POTASSIUM CHLORIDE CRYS ER 20 MEQ PO TBCR: 30.0000 meq | EXTENDED_RELEASE_TABLET | Freq: Every day | ORAL | Status: DC

## 2013-03-14 MED ORDER — FUROSEMIDE 40 MG PO TABS: ORAL_TABLET | ORAL | Status: DC

## 2013-03-14 NOTE — Patient Instructions (Signed)
Your physician recommends that you schedule a follow-up appointment in: 2 weeks  A chest x-ray takes a picture of the organs and structures inside the chest, including the heart, lungs, and blood vessels. This test can show several things, including, whether the heart is enlarges; whether fluid is building up in the lungs; and whether pacemaker / defibrillator leads are still in place.  Your physician recommends that you return for lab work today. Pro BNP, BMET, CBC  Your physician has recommended you make the following change in your medication:  1. Start Lasix 40 mg in the morning and 20 mg in the evening. 2. Start Potassium 30 mg daily.

## 2013-03-14 NOTE — Progress Notes (Addendum)
HPI: Danielle Walker is an 79 year old patient of Dr. Wyline Mood following for ongoing assessment and management of atrial fibrillation, not a candidate for anticoagulation due to GI bleed, hypertension, chronic diastolic CHF. She was last seen in the office in September of 2014. Was stable. The patient was planned for breast cyst removal. The patient was cleared from a cardiac standpoint to proceed with this. No changes in her medications were made.    She comes today having gained 15 lbs. Remains wheelchair bound. Complaints of wheezing, but is very sedentary. She is without complaint of chest pain or dizziness. She denies PND or orthopnea.   Allergies  Allergen Reactions  . Penicillins   . Sulfa Antibiotics Itching    Current Outpatient Prescriptions  Medication Sig Dispense Refill  . aspirin 81 MG tablet Take 81 mg by mouth daily.        . cetirizine (ZYRTEC) 10 MG tablet Take 10 mg by mouth daily.      . clotrimazole-betamethasone (LOTRISONE) cream       . donepezil (ARICEPT) 10 MG tablet Take 10 mg by mouth at bedtime.        . folic acid (FOLVITE) 1 MG tablet Take 1 mg by mouth daily.        . furosemide (LASIX) 20 MG tablet Take 1 tablet by mouth  twice a day  180 tablet  3  . lisinopril (PRINIVIL,ZESTRIL) 10 MG tablet Take 1 tablet (10 mg total) by mouth daily.  90 tablet  3  . memantine (NAMENDA) 10 MG tablet Take 10 mg by mouth 2 (two) times daily.       . mupirocin ointment (BACTROBAN) 2 %       . polyethylene glycol (GOLYTELY) 236 G solution Take 4,000 mLs by mouth once. Drink 8 ounces every 15 minutes until your passing clear liquid  4000 mL  0  . potassium chloride SA (K-DUR,KLOR-CON) 20 MEQ tablet Take 1 tablet (20 mEq total) by mouth daily.  90 tablet  3  . raloxifene (EVISTA) 60 MG tablet Take 60 mg by mouth daily.        . ranitidine (ZANTAC) 150 MG tablet Take 150 mg by mouth 2 (two) times daily.        . traZODone (DESYREL) 50 MG tablet Take 50 mg by mouth at bedtime.         . triamcinolone cream (KENALOG) 0.1 %        No current facility-administered medications for this visit.    Past Medical History  Diagnosis Date  . Arrhythmia   . CHF (congestive heart failure)   . Dementia   . Osteoporosis   . Hypertension   . GERD (gastroesophageal reflux disease)   . Anemia   . Blood transfusion     Past Surgical History  Procedure Laterality Date  . Hip fracture surgery    . Appendectomy      ZOX:WRUEAV of systems complete and found to be negative unless listed above  PHYSICAL EXAM BP 118/58  Pulse 69  Ht 6' (1.829 m)  Wt 235 lb (106.595 kg)  BMI 31.86 kg/m2  General: Well developed, well nourished, in no acute distress Head: Eyes PERRLA, No xanthomas.   Normal cephalic and atramatic  Lungs: Clear bilaterally but diminished in the bases. Poor inspiratory effort.  Heart: HRRR S1 S2, with 1/6 systolic murmur. Pulses are 2+ & equal.            No carotid bruit. No  JVD.  No abdominal bruits. No femoral bruits. Abdomen: Bowel sounds are positive, abdomen soft and non-tender without masses or                  Hernia's noted Msk:  Back normal, normal gait. Normal strength and tone for age. Extremities: No clubbing, cyanosis mild non-pitting,edema.  DP +1 Neuro: Alert and oriented X 3. Psych:  Good affect, responds appropriately    ASSESSMENT AND PLAN

## 2013-03-14 NOTE — Assessment & Plan Note (Signed)
Heart rate is well controlled. No changes in other medications at this time.

## 2013-03-14 NOTE — Assessment & Plan Note (Signed)
BP is low normal, but she has mild LEE. She is very sedentary with 15 lb wt gain. I have CXR and BMET, Pro-BNP, CBC and BMET drawn today. She will increase her lasix to 40mg  in am and 20 mg in pm. She will see [place] in a couple of weeks.

## 2013-03-14 NOTE — Assessment & Plan Note (Signed)
As stated, will increase her lasix in the am to 40 mg and keep her at 20 mg in pm. We will see her on follow up.

## 2013-03-14 NOTE — Progress Notes (Deleted)
Name: Danielle Walker    DOB: 1923/09/21  Age: 79 y.o.  MR#: [id number]       PCP:  Kirk Ruths, MD      Insurance: Payor: MEDICARE / Plan: MEDICARE PART A AND B / Product Type: *No Product type* /   CC:    Chief Complaint  Patient presents with  . Hypertension  . Atrial Fibrillation    VS Filed Vitals:   03/14/13 1603  BP: 118/58  Pulse: 69    Weights Current Weight  12/24/12 220 lb (99.791 kg)  06/28/12 222 lb (100.699 kg)  04/10/12 224 lb (101.606 kg)    Blood Pressure  BP Readings from Last 3 Encounters:  03/14/13 118/58  12/24/12 122/64  06/28/12 155/65     Admit date:  (Not on file) Last encounter with RMR:  12/24/2012   Allergy Penicillins and Sulfa antibiotics  Current Outpatient Prescriptions  Medication Sig Dispense Refill  . aspirin 81 MG tablet Take 81 mg by mouth daily.        . cetirizine (ZYRTEC) 10 MG tablet Take 10 mg by mouth daily.      . clotrimazole-betamethasone (LOTRISONE) cream       . donepezil (ARICEPT) 10 MG tablet Take 10 mg by mouth at bedtime.        . folic acid (FOLVITE) 1 MG tablet Take 1 mg by mouth daily.        . furosemide (LASIX) 20 MG tablet Take 1 tablet by mouth  twice a day  180 tablet  3  . lisinopril (PRINIVIL,ZESTRIL) 10 MG tablet Take 1 tablet (10 mg total) by mouth daily.  90 tablet  3  . memantine (NAMENDA) 10 MG tablet Take 10 mg by mouth 2 (two) times daily.       . mupirocin ointment (BACTROBAN) 2 %       . polyethylene glycol (GOLYTELY) 236 G solution Take 4,000 mLs by mouth once. Drink 8 ounces every 15 minutes until your passing clear liquid  4000 mL  0  . potassium chloride SA (K-DUR,KLOR-CON) 20 MEQ tablet Take 1 tablet (20 mEq total) by mouth daily.  90 tablet  3  . raloxifene (EVISTA) 60 MG tablet Take 60 mg by mouth daily.        . ranitidine (ZANTAC) 150 MG tablet Take 150 mg by mouth 2 (two) times daily.        . traZODone (DESYREL) 50 MG tablet Take 50 mg by mouth at bedtime.       . triamcinolone  cream (KENALOG) 0.1 %        No current facility-administered medications for this visit.    Discontinued Meds:    Medications Discontinued During This Encounter  Medication Reason  . Calcium Carb-Cholecalciferol (CALCIUM PLUS VITAMIN D3) 600-500 MG-UNIT CAPS Error  . diphenhydramine-acetaminophen (TYLENOL PM) 25-500 MG TABS Error    Patient Active Problem List   Diagnosis Date Noted  . Urinary incontinence 04/10/2012  . S/P breast biopsy 04/10/2012  . Bleeding from the nose 04/27/2011  . Atrial flutter 10/15/2010  . HTN (hypertension), benign 10/15/2010  . Diastolic CHF, chronic 09/30/2010  . Generalized abdominal swelling 09/19/2010  . Chronic atrial fibrillation 09/19/2010    LABS    Component Value Date/Time   NA 141 12/24/2012 1405   NA 142 06/28/2012 1154   NA 143 10/13/2011 1129   K 4.2 12/24/2012 1405   K 3.7 06/28/2012 1154   K 4.0 10/13/2011 1129  CL 98 12/24/2012 1405   CL 101 06/28/2012 1154   CL 105 10/13/2011 1129   CO2 34* 12/24/2012 1405   CO2 34* 06/28/2012 1154   CO2 32 10/13/2011 1129   GLUCOSE 126* 12/24/2012 1405   GLUCOSE 136* 06/28/2012 1154   GLUCOSE 100* 10/13/2011 1129   BUN 13 12/24/2012 1405   BUN 14 06/28/2012 1154   BUN 15 10/13/2011 1129   CREATININE 0.85 12/24/2012 1405   CREATININE 0.72 06/28/2012 1154   CREATININE 0.81 10/13/2011 1129   CREATININE 0.82 04/27/2011 1149   CREATININE <0.47* 10/15/2010 0610   CREATININE 0.68 10/14/2010 1516   CALCIUM 9.5 12/24/2012 1405   CALCIUM 9.5 06/28/2012 1154   CALCIUM 9.7 10/13/2011 1129   GFRNONAA 76* 06/28/2012 1154   GFRNONAA NOT CALCULATED 10/15/2010 0610   GFRNONAA >60 10/14/2010 1516   GFRAA 88* 06/28/2012 1154   GFRAA NOT CALCULATED 10/15/2010 0610   GFRAA >60 10/14/2010 1516   CMP     Component Value Date/Time   NA 141 12/24/2012 1405   K 4.2 12/24/2012 1405   CL 98 12/24/2012 1405   CO2 34* 12/24/2012 1405   GLUCOSE 126* 12/24/2012 1405   BUN 13 12/24/2012 1405   CREATININE 0.85 12/24/2012 1405   CREATININE 0.72  06/28/2012 1154   CALCIUM 9.5 12/24/2012 1405   PROT 7.3 06/28/2012 1154   ALBUMIN 3.5 06/28/2012 1154   AST 19 06/28/2012 1154   ALT 7 06/28/2012 1154   ALKPHOS 76 06/28/2012 1154   BILITOT 0.8 06/28/2012 1154   GFRNONAA 76* 06/28/2012 1154   GFRAA 88* 06/28/2012 1154       Component Value Date/Time   WBC 6.4 06/28/2012 1154   WBC 6.6 10/13/2011 1129   WBC 6.2 04/27/2011 1149   HGB 11.9* 06/28/2012 1154   HGB 12.5 10/13/2011 1129   HGB 12.4 04/27/2011 1149   HCT 38.3 06/28/2012 1154   HCT 38.8 10/13/2011 1129   HCT 40.0 04/27/2011 1149   MCV 99.2 06/28/2012 1154   MCV 93.5 10/13/2011 1129   MCV 100.0 04/27/2011 1149    Lipid Panel  No results found for this basename: chol, trig, hdl, cholhdl, vldl, ldlcalc    ABG No results found for this basename: phart, pco2, pco2art, po2, po2art, hco3, tco2, acidbasedef, o2sat     Lab Results  Component Value Date   TSH 1.183 10/15/2010   BNP (last 3 results) No results found for this basename: PROBNP,  in the last 8760 hours Cardiac Panel (last 3 results) No results found for this basename: CKTOTAL, CKMB, TROPONINI, RELINDX,  in the last 72 hours  Iron/TIBC/Ferritin    Component Value Date/Time   IRON 64 02/02/2009 1142   TIBC 314 02/02/2009 1142   FERRITIN 162 02/02/2009 1142     EKG Orders placed in visit on 12/24/12  . EKG 12-LEAD     Prior Assessment and Plan Problem List as of 03/14/2013   Generalized abdominal swelling   Chronic atrial fibrillation   Last Assessment & Plan   04/10/2012 Office Visit Written 04/10/2012  2:12 PM by Dyann Kief, PA     Controlled rate. No Coumadin do to GI bleed. Stable    Diastolic CHF, chronic   Last Assessment & Plan   12/24/2012 Office Visit Written 12/24/2012  2:10 PM by Jodelle Gross, NP     There is no evidence of fluid overload on exam. She is very inactive and has chronic O2 dependence. I will have BMET drawn  as she has not had labs in 6 months and is on ACE and diuretic for kidney fx.    Atrial  flutter   Last Assessment & Plan   12/24/2012 Office Visit Written 12/24/2012  2:08 PM by Jodelle Gross, NP     She is rate controlled. Will continue current medications without rate control agents. She is not a candidate for anticoagulants    HTN (hypertension), benign   Last Assessment & Plan   12/24/2012 Office Visit Written 12/24/2012  2:10 PM by Jodelle Gross, NP     Excellent control of BP at this time. Continue medications as directed.    Bleeding from the nose   Last Assessment & Plan   04/27/2011 Office Visit Written 04/27/2011 12:24 PM by Jodelle Gross, NP     I suspect that this is related to her use of [place] O2. I have examined her nares with light and found a sore on the nasal septum with bright red and dried blood in the right nares. I have given her a Rx for NaSol solution, Z-Pack to treat for bacterial infection and topical neosporin.     Urinary incontinence   S/P breast biopsy   Last Assessment & Plan   04/10/2012 Office Visit Written 04/10/2012  2:15 PM by Dyann Kief, PA     Patient family chose not to have her rest cyst removed because of her comorbidities. It has enlarged since her biopsy and they are concerned and wants a second opinion. They will see Dr. Jerelyn Scott.        Imaging: No results found.

## 2013-03-15 LAB — BASIC METABOLIC PANEL
CO2: 37 mEq/L — ABNORMAL HIGH (ref 19–32)
Calcium: 8.9 mg/dL (ref 8.4–10.5)
Chloride: 98 mEq/L (ref 96–112)
Glucose, Bld: 110 mg/dL — ABNORMAL HIGH (ref 70–99)
Sodium: 143 mEq/L (ref 135–145)

## 2013-03-17 ENCOUNTER — Encounter (HOSPITAL_COMMUNITY): Payer: Self-pay | Admitting: Emergency Medicine

## 2013-03-17 ENCOUNTER — Emergency Department (HOSPITAL_COMMUNITY): Payer: Medicare Other

## 2013-03-17 ENCOUNTER — Emergency Department (HOSPITAL_COMMUNITY)
Admission: EM | Admit: 2013-03-17 | Discharge: 2013-03-17 | Disposition: A | Discharge status: Home / Self Care | Payer: Medicare Other | Attending: Emergency Medicine | Admitting: Emergency Medicine

## 2013-03-17 DIAGNOSIS — I509 Heart failure, unspecified: Secondary | ICD-10-CM | POA: Insufficient documentation

## 2013-03-17 DIAGNOSIS — Z7982 Long term (current) use of aspirin: Secondary | ICD-10-CM | POA: Insufficient documentation

## 2013-03-17 DIAGNOSIS — Z79899 Other long term (current) drug therapy: Secondary | ICD-10-CM | POA: Insufficient documentation

## 2013-03-17 DIAGNOSIS — R062 Wheezing: Secondary | ICD-10-CM | POA: Insufficient documentation

## 2013-03-17 DIAGNOSIS — M810 Age-related osteoporosis without current pathological fracture: Secondary | ICD-10-CM | POA: Insufficient documentation

## 2013-03-17 DIAGNOSIS — Z880 Allergy status to penicillin: Secondary | ICD-10-CM | POA: Insufficient documentation

## 2013-03-17 DIAGNOSIS — F0390 Unspecified dementia without behavioral disturbance: Secondary | ICD-10-CM | POA: Insufficient documentation

## 2013-03-17 DIAGNOSIS — Z87891 Personal history of nicotine dependence: Secondary | ICD-10-CM | POA: Insufficient documentation

## 2013-03-17 DIAGNOSIS — D649 Anemia, unspecified: Secondary | ICD-10-CM | POA: Insufficient documentation

## 2013-03-17 DIAGNOSIS — K219 Gastro-esophageal reflux disease without esophagitis: Secondary | ICD-10-CM | POA: Insufficient documentation

## 2013-03-17 DIAGNOSIS — I10 Essential (primary) hypertension: Secondary | ICD-10-CM | POA: Insufficient documentation

## 2013-03-17 LAB — CBC WITH DIFFERENTIAL/PLATELET
Eosinophils Relative: 2 % (ref 0–5)
HCT: 36.8 % (ref 36.0–46.0)
Hemoglobin: 10.9 g/dL — ABNORMAL LOW (ref 12.0–15.0)
Lymphocytes Relative: 19 % (ref 12–46)
Lymphs Abs: 1.2 10*3/uL (ref 0.7–4.0)
MCV: 103.1 fL — ABNORMAL HIGH (ref 78.0–100.0)
Monocytes Absolute: 0.6 10*3/uL (ref 0.1–1.0)
Monocytes Relative: 10 % (ref 3–12)
Neutro Abs: 4.1 10*3/uL (ref 1.7–7.7)
RBC: 3.57 MIL/uL — ABNORMAL LOW (ref 3.87–5.11)
RDW: 14.1 % (ref 11.5–15.5)
WBC: 5.9 10*3/uL (ref 4.0–10.5)

## 2013-03-17 LAB — PRO B NATRIURETIC PEPTIDE: Pro B Natriuretic peptide (BNP): 3530 pg/mL — ABNORMAL HIGH (ref 0–450)

## 2013-03-17 LAB — COMPREHENSIVE METABOLIC PANEL
BUN: 13 mg/dL (ref 6–23)
CO2: 40 mEq/L (ref 19–32)
Chloride: 96 mEq/L (ref 96–112)
Creatinine, Ser: 0.76 mg/dL (ref 0.50–1.10)
GFR calc Af Amer: 85 mL/min — ABNORMAL LOW (ref >90)
GFR calc non Af Amer: 74 mL/min — ABNORMAL LOW (ref >90)
Glucose, Bld: 163 mg/dL — ABNORMAL HIGH (ref 70–99)
Total Bilirubin: 0.8 mg/dL (ref 0.3–1.2)

## 2013-03-17 MED ORDER — FUROSEMIDE 10 MG/ML IJ SOLN
40.0000 mg | Freq: Once | INTRAMUSCULAR | Status: AC
  Administered 2013-03-17: 40 mg via INTRAVENOUS
  Filled 2013-03-17: qty 4

## 2013-03-17 NOTE — ED Notes (Signed)
Patient with no complaints at this time. Respirations even and unlabored. Skin warm/dry. Discharge instructions reviewed with patient at this time. Patient given opportunity to voice concerns/ask questions. IV removed per policy and band-aid applied to site. Patient discharged at this time and left Emergency Department with steady gait.  

## 2013-03-17 NOTE — ED Provider Notes (Signed)
CSN: [id number]     Arrival date & time 03/17/13  1017 History  This chart was scribed for [name], MD by Leone Payor, ED Scribe. This patient was seen in room APA12/APA12 and the patient's care was started 10:50 AM.      Chief Complaint  Patient presents with  . Shortness of Breath    Patient is a 79 y.o. female presenting with shortness of breath. The history is provided by the patient. No language interpreter was used.  Shortness of Breath Severity:  Moderate Onset quality:  Gradual Duration:  3 days Timing:  Constant Progression:  Worsening Chronicity:  New Relieved by:  Oxygen Ineffective treatments:  None tried Associated symptoms: wheezing   Associated symptoms: no abdominal pain, no chest pain, no cough, no headaches and no rash     HPI Comments: Danielle Walker is a 79 y.o. female who presents to the Emergency Department complaining of several days of gradual onset, gradually worsening, constant wheezing and SOB. She was seen by [place] on 03/14/13 when she had blood work and a CXR. She was called today and told to come to ED for CHF. Pt is using 2 L of oxygen at home. She takes Lasix 40 mg which was increased a couple of days ago.   Past Medical History  Diagnosis Date  . Arrhythmia   . CHF (congestive heart failure)   . Dementia   . Osteoporosis   . Hypertension   . GERD (gastroesophageal reflux disease)   . Anemia   . Blood transfusion    Past Surgical History  Procedure Laterality Date  . Hip fracture surgery    . Appendectomy     Family History  Problem Relation Age of Onset  . Heart disease Mother   . Heart disease Father    History  Substance Use Topics  . Smoking status: Former Games developer  . Smokeless tobacco: Never Used  . Alcohol Use: No   OB History   Grav Para Term Preterm Abortions TAB SAB Ect Mult Living                 Review of Systems  Constitutional: Negative for appetite change and fatigue.  HENT: Negative for congestion, ear  discharge and sinus pressure.   Eyes: Negative for discharge.  Respiratory: Positive for shortness of breath and wheezing. Negative for cough.   Cardiovascular: Negative for chest pain.  Gastrointestinal: Negative for abdominal pain and diarrhea.  Genitourinary: Negative for frequency and hematuria.  Musculoskeletal: Negative for back pain.  Skin: Negative for rash.  Neurological: Negative for seizures and headaches.  Psychiatric/Behavioral: Negative for hallucinations.    Allergies  Penicillins and Sulfa antibiotics  Home Medications   Current Outpatient Rx  Name  Route  Sig  Dispense  Refill  . aspirin 81 MG tablet   Oral   Take 81 mg by mouth daily.           . cetirizine (ZYRTEC) 10 MG tablet   Oral   Take 10 mg by mouth daily.         . clotrimazole-betamethasone (LOTRISONE) cream               . donepezil (ARICEPT) 10 MG tablet   Oral   Take 10 mg by mouth at bedtime.           . folic acid (FOLVITE) 1 MG tablet   Oral   Take 1 mg by mouth daily.           Marland Kitchen  furosemide (LASIX) 40 MG tablet      Take Lasix 40 mg in th morning and 20 mg in the evening.   180 tablet   3   . lisinopril (PRINIVIL,ZESTRIL) 10 MG tablet   Oral   Take 1 tablet (10 mg total) by mouth daily.   90 tablet   3   . memantine (NAMENDA) 10 MG tablet   Oral   Take 10 mg by mouth 2 (two) times daily.          . mupirocin ointment (BACTROBAN) 2 %               . polyethylene glycol (GOLYTELY) 236 G solution   Oral   Take 4,000 mLs by mouth once. Drink 8 ounces every 15 minutes until your passing clear liquid   4000 mL   0   . potassium chloride SA (K-DUR,KLOR-CON) 20 MEQ tablet   Oral   Take 1.5 tablets (30 mEq total) by mouth daily.   90 tablet   3   . raloxifene (EVISTA) 60 MG tablet   Oral   Take 60 mg by mouth daily.           . ranitidine (ZANTAC) 150 MG tablet   Oral   Take 150 mg by mouth 2 (two) times daily.           . traZODone (DESYREL) 50  MG tablet   Oral   Take 50 mg by mouth at bedtime.          . triamcinolone cream (KENALOG) 0.1 %                BP 149/90  Pulse 85  Temp(Src) 97.9 F (36.6 C) (Oral)  Resp 15  SpO2 91% Physical Exam  Nursing note and vitals reviewed. Constitutional: She is oriented to person, place, and time. She appears well-developed.  HENT:  Head: Normocephalic.  Eyes: Conjunctivae and EOM are normal. No scleral icterus.  Neck: Neck supple. No thyromegaly present.  Cardiovascular: Normal rate and normal heart sounds.  An irregular rhythm present. Exam reveals no gallop and no friction rub.   No murmur heard. Pulmonary/Chest: No stridor. She has wheezes (  minimal wheezing bilaterally). She has no rales. She exhibits no tenderness.  Abdominal: She exhibits no distension. There is no tenderness. There is no rebound.  Musculoskeletal: Normal range of motion. She exhibits no edema.  Lymphadenopathy:    She has no cervical adenopathy.  Neurological: She is oriented to person, place, and time. She exhibits normal muscle tone. Coordination normal.  Skin: No rash noted. No erythema.  Psychiatric: She has a normal mood and affect. Her behavior is normal.    ED Course  Procedures   DIAGNOSTIC STUDIES: Oxygen Saturation is 91% on RA, low by my interpretation.    COORDINATION OF CARE: 10:51 AM Discussed treatment plan with pt at bedside and pt agreed to plan.    Lab Review Labs Reviewed - No data to display Imaging Review No results found.  EKG Interpretation    Date/Time:  Monday March 17 2013 10:32:21 EST Ventricular Rate:  67 PR Interval:    QRS Duration: 94 QT Interval:  392 QTC Calculation: 414 R Axis:   60 Text Interpretation:  Atrial fibrillation Incomplete right bundle branch block Septal infarct , age undetermined Abnormal ECG When compared with ECG of 15-Oct-2010 05:12, Septal infarct is now Present T wave inversion no longer evident in Inferior leads Nonspecific T  wave abnormality  now evident in Anterior leads Confirmed by Blakeleigh Domek  MD, Muriel Wilber ([id number]) on 03/17/2013 1:57:49 PM            MDM  Mild chf,  Pt improved with lasix.  Will increase lasix to 40mg  bid and have her follow up in 2 days  [name], MD 03/17/13 1358

## 2013-03-17 NOTE — ED Notes (Signed)
Patient incontinent of urine and stool. Patient cleaned and full linen change completed.

## 2013-03-17 NOTE — Assessment & Plan Note (Signed)
CXR revealed: The heart is enlarged. There is increased pulmonary vascularity with possible mild edema. No confluent airspace opacity is demonstrated. There is mild blunting of the posterior costophrenic angles on the lateral view which could indicate small bilateral pleural effusions. The osseous structures appear unchanged.  IMPRESSION: Cardiomegaly with vascular congestion, possible edema and possible small bilateral pleural effusions.  Called daughter this am with results. She has had no diureses from increased lasix. She will go to ER to be admitted, or see Dr. Regino Schultze and be admitted through his office.

## 2013-03-17 NOTE — ED Notes (Signed)
Family reports pt has had sob and wheezing for few days.  Reports went to [facility] Friday and had blood work and cxr.  Reports was called today and was told to come to ED for CHF.

## 2013-03-21 ENCOUNTER — Ambulatory Visit (INDEPENDENT_AMBULATORY_CARE_PROVIDER_SITE_OTHER): Payer: Medicare Other | Admitting: Internal Medicine

## 2013-03-21 ENCOUNTER — Encounter: Payer: Self-pay | Admitting: Internal Medicine

## 2013-03-21 VITALS — BP 127/58 | HR 67 | Ht 70.0 in | Wt 228.0 lb

## 2013-03-21 DIAGNOSIS — R0602 Shortness of breath: Secondary | ICD-10-CM

## 2013-03-21 DIAGNOSIS — I10 Essential (primary) hypertension: Secondary | ICD-10-CM

## 2013-03-21 LAB — BASIC METABOLIC PANEL
Calcium: 9.4 mg/dL (ref 8.4–10.5)
Glucose, Bld: 121 mg/dL — ABNORMAL HIGH (ref 70–99)
Sodium: 143 mEq/L (ref 135–145)

## 2013-03-21 LAB — PRO B NATRIURETIC PEPTIDE: Pro B Natriuretic peptide (BNP): 1450 pg/mL — ABNORMAL HIGH (ref <451)

## 2013-03-21 NOTE — Progress Notes (Signed)
HPI Patient is an 79 yo who presents for evlaution after being seen in ER earlier this week SH has a history of  afib (not a coumadin candidate), HTN, chronic diastolic CHF.   She was seen by Harriet Pho recently.   She presented to ER Monday weak, SOB She was given lasix IV  Labs and XRay done  Mild CHF She was sent home on Lasix 40 bid.  Since ER visit family says her wt has gonde down.  She denies CP  No SOB at rest  Sleeping OK      Allergies  Allergen Reactions  . Penicillins Hives  . Sulfa Antibiotics Itching    Current Outpatient Prescriptions  Medication Sig Dispense Refill  . acetaminophen (TYLENOL) 650 MG CR tablet Take 650 mg by mouth at bedtime.      Marland Kitchen aspirin 81 MG tablet Take 81 mg by mouth daily.        . cetirizine (ZYRTEC) 10 MG tablet Take 10 mg by mouth daily.      . clotrimazole-betamethasone (LOTRISONE) cream 1 application daily as needed (ulcers).       . donepezil (ARICEPT) 10 MG tablet Take 10 mg by mouth at bedtime.        . folic acid (FOLVITE) 1 MG tablet Take 1 mg by mouth daily.        . furosemide (LASIX) 40 MG tablet 40 mg 2 (two) times daily. Take Lasix 40 mg in th morning and 20 mg in the evening.      Marland Kitchen lisinopril (PRINIVIL,ZESTRIL) 10 MG tablet Take 1 tablet (10 mg total) by mouth daily.  90 tablet  3  . memantine (NAMENDA) 10 MG tablet Take 10 mg by mouth 2 (two) times daily.       . mupirocin ointment (BACTROBAN) 2 % Apply 1 application topically daily as needed (ulcers).       . potassium chloride SA (K-DUR,KLOR-CON) 20 MEQ tablet Take 1.5 tablets (30 mEq total) by mouth daily.  90 tablet  3  . raloxifene (EVISTA) 60 MG tablet Take 60 mg by mouth daily.        . ranitidine (ZANTAC) 150 MG tablet Take 150 mg by mouth 2 (two) times daily.        . traZODone (DESYREL) 50 MG tablet Take 50 mg by mouth at bedtime.       . triamcinolone cream (KENALOG) 0.1 % 1 application daily as needed (ulcers).        No current facility-administered medications for  this visit.    Past Medical History  Diagnosis Date  . Arrhythmia   . CHF (congestive heart failure)   . Dementia   . Osteoporosis   . Hypertension   . GERD (gastroesophageal reflux disease)   . Anemia   . Blood transfusion     Past Surgical History  Procedure Laterality Date  . Hip fracture surgery    . Appendectomy      Family History  Problem Relation Age of Onset  . Heart disease Mother   . Heart disease Father     History   Social History  . Marital Status: Widowed    Spouse Name: N/A    Number of Children: N/A  . Years of Education: N/A   Occupational History  . Not on file.   Social History Main Topics  . Smoking status: Former Games developer  . Smokeless tobacco: Never Used  . Alcohol Use: No  . Drug Use: No  .  Sexual Activity: No   Other Topics Concern  . Not on file   Social History Narrative  . No narrative on file    Review of Systems:  All systems reviewed.  They are negative to the above problem except as previously stated.  Vital Signs: BP 127/58  Pulse 67  Ht 5\' 10"  (1.778 m)  Wt 228 lb (103.42 kg)  BMI 32.71 kg/m2  Physical Exam Patient is in NAD  Examined in wheelchair.   HEENT:  Normocephalic, atraumatic. EOMI, PERRLA.  Neck: JVP is normal.  No bruits.  Lungs: clear to auscultation. No rales no wheezes.  Heart: Regular rate and rhythm. Normal S1, S2. No S3.   No significant murmurs. PMI not displaced.  Abdomen:  Supple, nontender. Normal bowel sounds.  Extremities:   Good distal pulses throughout. Tr lower extremity edema.    Assessment and Plan: 1Acute on chronic diastolc CHF  Volume status is not too bad  Will check labs.  Keep on same regimen for now.  F/U with Harriet Pho   2.  Atrial firillation.  Rate cntrol  3.  HTn  Adequate control  Patient has appt next wk with Harriet Pho  I would keep

## 2013-03-21 NOTE — Patient Instructions (Signed)
KEEP YOUR FOLLOW UP APPOINTMENT WITH Lorin Picket NP ON 03-31-2012  Your physician recommends that you return for lab work in: TODAY (SLIPS GIVEN FOR BNP,BMET)

## 2013-03-25 LAB — BASIC METABOLIC PANEL
BUN: 16 mg/dL (ref 6–23)
CO2: 42 mEq/L — ABNORMAL HIGH (ref 19–32)
Chloride: 93 mEq/L — ABNORMAL LOW (ref 96–112)
Creat: 0.72 mg/dL (ref 0.50–1.10)
Glucose, Bld: 151 mg/dL — ABNORMAL HIGH (ref 70–99)
Potassium: 4.5 mEq/L (ref 3.5–5.3)

## 2013-03-25 NOTE — Addendum Note (Signed)
Addended by: Derry Lory A on: 03/25/2013 08:27 AM   Modules accepted: Orders

## 2013-03-31 ENCOUNTER — Encounter: Payer: Self-pay | Admitting: Adult Health

## 2013-03-31 ENCOUNTER — Ambulatory Visit (INDEPENDENT_AMBULATORY_CARE_PROVIDER_SITE_OTHER): Payer: Medicare Other | Admitting: Adult Health

## 2013-03-31 VITALS — BP 130/60 | HR 72 | Ht 72.0 in | Wt 229.2 lb

## 2013-03-31 DIAGNOSIS — I509 Heart failure, unspecified: Secondary | ICD-10-CM

## 2013-03-31 DIAGNOSIS — I482 Chronic atrial fibrillation, unspecified: Secondary | ICD-10-CM

## 2013-03-31 DIAGNOSIS — I4891 Unspecified atrial fibrillation: Secondary | ICD-10-CM

## 2013-03-31 DIAGNOSIS — I5032 Chronic diastolic (congestive) heart failure: Secondary | ICD-10-CM

## 2013-03-31 DIAGNOSIS — I10 Essential (primary) hypertension: Secondary | ICD-10-CM

## 2013-03-31 NOTE — Progress Notes (Signed)
HPI:  Danielle Walker is a 80 y/o patient of Dr.Branch we are following for ongoing assessment and management of diastolic CHF, hypertension, and atrial fibrillation. She is not a coumadin candidate secondary to GIB. She is here on follow up after being seen last week by Dr.Ross after ER visit for fluid retention. She was placed on lasix 40 mg BID in ER and was not changed on her dose by Dr.Ross.   She is here for close follow up for any new symptoms or further fluid retention. She is without complaint of LEE, or worsening dyspnea. Her family states that she is often found holding her head but does not complain of headache. She does state that she is sometimes dizzy.  Allergies  Allergen Reactions  . Penicillins Hives  . Sulfa Antibiotics Itching    Current Outpatient Prescriptions  Medication Sig Dispense Refill  . acetaminophen (TYLENOL) 650 MG CR tablet Take 650 mg by mouth at bedtime.      Marland Kitchen aspirin 81 MG tablet Take 81 mg by mouth daily.        . cetirizine (ZYRTEC) 10 MG tablet Take 10 mg by mouth daily.      . clotrimazole-betamethasone (LOTRISONE) cream 1 application daily as needed (ulcers).       . donepezil (ARICEPT) 10 MG tablet Take 10 mg by mouth at bedtime.        . folic acid (FOLVITE) 1 MG tablet Take 1 mg by mouth daily.        . furosemide (LASIX) 40 MG tablet 40 mg 2 (two) times daily.       Marland Kitchen lisinopril (PRINIVIL,ZESTRIL) 10 MG tablet Take 1 tablet (10 mg total) by mouth daily.  90 tablet  3  . memantine (NAMENDA) 10 MG tablet Take 10 mg by mouth 2 (two) times daily.       . mupirocin ointment (BACTROBAN) 2 % Apply 1 application topically daily as needed (ulcers).       . potassium chloride SA (K-DUR,KLOR-CON) 20 MEQ tablet Take 1.5 tablets (30 mEq total) by mouth daily.  90 tablet  3  . raloxifene (EVISTA) 60 MG tablet Take 60 mg by mouth daily.        . ranitidine (ZANTAC) 150 MG tablet Take 150 mg by mouth 2 (two) times daily.        . traZODone (DESYREL) 50 MG  tablet Take 50 mg by mouth at bedtime.       . triamcinolone cream (KENALOG) 0.1 % 1 application daily as needed (ulcers).        No current facility-administered medications for this visit.    Past Medical History  Diagnosis Date  . Arrhythmia   . CHF (congestive heart failure)   . Dementia   . Osteoporosis   . Hypertension   . GERD (gastroesophageal reflux disease)   . Anemia   . Blood transfusion     Past Surgical History  Procedure Laterality Date  . Hip fracture surgery    . Appendectomy      AOZ:HYQMVH of systems complete and found to be negative unless listed above  PHYSICAL EXAM BP 130/60  Pulse 72  Ht 6' (1.829 m)  Wt 229 lb 4 oz (103.987 kg)  BMI 31.09 kg/m2 General: Well developed, obese, well nourished, in no acute distress Head: Eyes PERRLA, No xanthomas.   Normal cephalic and atramatic  Lungs: Clear bilaterally to auscultation and percussion. Heart: HRRR S1 S2, without MRG.  Pulses are  2+ & equal.            No carotid bruit. No JVD.  No abdominal bruits. No femoral bruits. Abdomen: Bowel sounds are positive, abdomen soft and non-tender without masses or                  Hernia's noted. Msk:  Back normal, normal gait. Normal strength and tone for age. Extremities: No clubbing, cyanosis or edema.  DP +1 Neuro: Alert and oriented X 3. Psych:  Good affect, responds appropriately    ASSESSMENT AND PLAN

## 2013-03-31 NOTE — Progress Notes (Deleted)
Name: Danielle Walker    DOB: [birth year]  Age: 80 y.o.  MR#: [id number]       PCP:  Kirk RuthsMCGOUGH,[name], MD      Insurance: Payor: MEDICARE / Plan: MEDICARE PART A AND B / Product Type: *No Product type* /   CC:   No chief complaint on file.   VS Filed Vitals:   03/31/13 1440  BP: 130/60  Pulse: 72  Height: 6' (1.829 m)  Weight: 229 lb 4 oz (103.987 kg)    Weights Current Weight  03/31/13 229 lb 4 oz (103.987 kg)  03/21/13 228 lb (103.42 kg)  03/14/13 235 lb (106.595 kg)    Blood Pressure  BP Readings from Last 3 Encounters:  03/31/13 130/60  03/21/13 127/58  03/17/13 135/90     Admit date:  (Not on file) Last encounter with RMR:  03/14/2013   Allergy Penicillins and Sulfa antibiotics  Current Outpatient Prescriptions  Medication Sig Dispense Refill  . acetaminophen (TYLENOL) 650 MG CR tablet Take 650 mg by mouth at bedtime.      Marland Kitchen. aspirin 81 MG tablet Take 81 mg by mouth daily.        . cetirizine (ZYRTEC) 10 MG tablet Take 10 mg by mouth daily.      . clotrimazole-betamethasone (LOTRISONE) cream 1 application daily as needed (ulcers).       . donepezil (ARICEPT) 10 MG tablet Take 10 mg by mouth at bedtime.        . folic acid (FOLVITE) 1 MG tablet Take 1 mg by mouth daily.        . furosemide (LASIX) 40 MG tablet 40 mg 2 (two) times daily.       Marland Kitchen. lisinopril (PRINIVIL,ZESTRIL) 10 MG tablet Take 1 tablet (10 mg total) by mouth daily.  90 tablet  3  . memantine (NAMENDA) 10 MG tablet Take 10 mg by mouth 2 (two) times daily.       . mupirocin ointment (BACTROBAN) 2 % Apply 1 application topically daily as needed (ulcers).       . potassium chloride SA (K-DUR,KLOR-CON) 20 MEQ tablet Take 1.5 tablets (30 mEq total) by mouth daily.  90 tablet  3  . raloxifene (EVISTA) 60 MG tablet Take 60 mg by mouth daily.        . ranitidine (ZANTAC) 150 MG tablet Take 150 mg by mouth 2 (two) times daily.        . traZODone (DESYREL) 50 MG tablet Take 50 mg by mouth at bedtime.       .  triamcinolone cream (KENALOG) 0.1 % 1 application daily as needed (ulcers).        No current facility-administered medications for this visit.    Discontinued Meds:   There are no discontinued medications.  Patient Active Problem List   Diagnosis Date Noted  . Urinary incontinence 04/10/2012  . S/P breast biopsy 04/10/2012  . Bleeding from the nose 04/27/2011  . Atrial flutter 10/15/2010  . HTN (hypertension), benign 10/15/2010  . Diastolic CHF, chronic 09/30/2010  . Generalized abdominal swelling 09/19/2010  . Chronic atrial fibrillation 09/19/2010    LABS    Component Value Date/Time   NA 141 03/25/2013 1220   NA 143 03/21/2013 1213   NA 143 03/17/2013 1049   K 4.5 03/25/2013 1220   K 4.3 03/21/2013 1213   K 4.3 03/17/2013 1049   CL 93* 03/25/2013 1220   CL 96 03/21/2013 1213   CL 96 03/17/2013 1049  CO2 42* 03/25/2013 1220   CO2 42* 03/21/2013 1213   CO2 40* 03/17/2013 1049   GLUCOSE 151* 03/25/2013 1220   GLUCOSE 121* 03/21/2013 1213   GLUCOSE 163* 03/17/2013 1049   BUN 16 03/25/2013 1220   BUN 13 03/21/2013 1213   BUN 13 03/17/2013 1049   CREATININE 0.72 03/25/2013 1220   CREATININE 0.81 03/21/2013 1213   CREATININE 0.76 03/17/2013 1049   CREATININE 0.68 03/14/2013 1628   CREATININE 0.72 06/28/2012 1154   CREATININE <0.47* 10/15/2010 0610   CALCIUM 9.5 03/25/2013 1220   CALCIUM 9.4 03/21/2013 1213   CALCIUM 9.6 03/17/2013 1049   GFRNONAA 74* 03/17/2013 1049   GFRNONAA 76* 06/28/2012 1154   GFRNONAA NOT CALCULATED 10/15/2010 0610   GFRAA 85* 03/17/2013 1049   GFRAA 88* 06/28/2012 1154   GFRAA NOT CALCULATED 10/15/2010 0610   CMP     Component Value Date/Time   NA 141 03/25/2013 1220   K 4.5 03/25/2013 1220   CL 93* 03/25/2013 1220   CO2 42* 03/25/2013 1220   GLUCOSE 151* 03/25/2013 1220   BUN 16 03/25/2013 1220   CREATININE 0.72 03/25/2013 1220   CREATININE 0.76 03/17/2013 1049   CALCIUM 9.5 03/25/2013 1220   PROT 7.9 03/17/2013 1049   ALBUMIN 3.7  03/17/2013 1049   AST 28 03/17/2013 1049   ALT 9 03/17/2013 1049   ALKPHOS 82 03/17/2013 1049   BILITOT 0.8 03/17/2013 1049   GFRNONAA 74* 03/17/2013 1049   GFRAA 85* 03/17/2013 1049       Component Value Date/Time   WBC 5.9 03/17/2013 1049   WBC 6.1 03/14/2013 1628   WBC 6.4 06/28/2012 1154   HGB 10.9* 03/17/2013 1049   HGB 10.6* 03/14/2013 1628   HGB 11.9* 06/28/2012 1154   HCT 36.8 03/17/2013 1049   HCT 33.2* 03/14/2013 1628   HCT 38.3 06/28/2012 1154   MCV 103.1* 03/17/2013 1049   MCV 95.4 03/14/2013 1628   MCV 99.2 06/28/2012 1154    Lipid Panel  No results found for this basename: chol, trig, hdl, cholhdl, vldl, ldlcalc    ABG No results found for this basename: phart, pco2, pco2art, po2, po2art, hco3, tco2, acidbasedef, o2sat     Lab Results  Component Value Date   TSH 1.183 10/15/2010   BNP (last 3 results)  Recent Labs  03/17/13 1049 03/21/13 1213  PROBNP 3530.0* 1450*   Cardiac Panel (last 3 results) No results found for this basename: CKTOTAL, CKMB, TROPONINI, RELINDX,  in the last 72 hours  Iron/TIBC/Ferritin    Component Value Date/Time   IRON 64 02/02/2009 1142   TIBC 314 02/02/2009 1142   FERRITIN 162 02/02/2009 1142     EKG Orders placed during the hospital encounter of 03/17/13  . EKG 12-LEAD  . EKG 12-LEAD  . EKG     Prior Assessment and Plan Problem List as of 03/31/2013   Diastolic CHF, chronic   Last Assessment & Plan   03/14/2013 Office Visit Written 03/14/2013  4:34 PM by Jodelle Gross, NP     As stated, will increase her lasix in the am to 40 mg and keep her at 20 mg in pm. We will see her on follow up.    HTN (hypertension), benign   Last Assessment & Plan   03/14/2013 Office Visit Written 03/14/2013  4:32 PM by Jodelle Gross, NP     BP is low normal, but she has mild LEE. She is very sedentary with 15 lb wt gain.  I have CXR and BMET, Pro-BNP, CBC and BMET drawn today. She will increase her lasix to 40mg  in am and 20 mg in  pm. She will see [place] in a couple of weeks.     Generalized abdominal swelling   Last Assessment & Plan   03/14/2013 Office Visit Written 03/17/2013  7:42 AM by Jodelle Gross, NP     CXR revealed: The heart is enlarged. There is increased pulmonary vascularity with possible mild edema. No confluent airspace opacity is demonstrated. There is mild blunting of the posterior costophrenic angles on the lateral view which could indicate small bilateral pleural effusions. The osseous structures appear unchanged.  IMPRESSION: Cardiomegaly with vascular congestion, possible edema and possible small bilateral pleural effusions.  Called daughter this am with results. She has had no diureses from increased lasix. She will go to ER to be admitted, or see Dr. Regino Schultze and be admitted through his office.    Chronic atrial fibrillation   Last Assessment & Plan   03/14/2013 Office Visit Written 03/14/2013  4:33 PM by Jodelle Gross, NP     Heart rate is well controlled. No changes in other medications at this time.     Atrial flutter   Last Assessment & Plan   12/24/2012 Office Visit Written 12/24/2012  2:08 PM by Jodelle Gross, NP     She is rate controlled. Will continue current medications without rate control agents. She is not a candidate for anticoagulants    Bleeding from the nose   Last Assessment & Plan   04/27/2011 Office Visit Written 04/27/2011 12:24 PM by Jodelle Gross, NP     I suspect that this is related to her use of [place] O2. I have examined her nares with light and found a sore on the nasal septum with bright red and dried blood in the right nares. I have given her a Rx for NaSol solution, Z-Pack to treat for bacterial infection and topical neosporin.     Urinary incontinence   S/P breast biopsy   Last Assessment & Plan   04/10/2012 Office Visit Written 04/10/2012  2:15 PM by Dyann Kief, PA     Patient family chose not to have her rest cyst removed because of her  comorbidities. It has enlarged since her biopsy and they are concerned and wants a second opinion. They will see Dr. Jerelyn Scott.        Imaging: Dg Chest 2 View  03/17/2013   CLINICAL DATA:  Shortness of breath and wheezing.  EXAM: CHEST - 2 VIEW  COMPARISON:  03/14/2013  FINDINGS: Stable cardiomegaly. Lung volumes are low. There remains evidence of probable mild CHF. No significant pleural fluid is identified.  IMPRESSION: Low lung volumes with stable mild CHF.   Electronically Signed   By: Irish Lack M.D.   On: 03/17/2013 11:21   Dg Chest 2 View  03/14/2013   CLINICAL DATA:  Shortness of breath with wheezing.  EXAM: CHEST  2 VIEW  COMPARISON:  10/14/2010 and 06/16/2010.  FINDINGS: The heart is enlarged. There is increased pulmonary vascularity with possible mild edema. No confluent airspace opacity is demonstrated. There is mild blunting of the posterior costophrenic angles on the lateral view which could indicate small bilateral pleural effusions. The osseous structures appear unchanged.  IMPRESSION: Cardiomegaly with vascular congestion, possible edema and possible small bilateral pleural effusions.   Electronically Signed   By: Roxy Horseman M.D.   On: 03/14/2013 18:05

## 2013-03-31 NOTE — Assessment & Plan Note (Signed)
Heart rate is well controlled. She will not be placed on coumadin as she has h/o GIB

## 2013-03-31 NOTE — Assessment & Plan Note (Signed)
No evidence of fluid retention or dyspnea. Wt is unchanged. Will continue current medications and daily wts. She is to avoid salty foods.

## 2013-03-31 NOTE — Assessment & Plan Note (Signed)
BP is well controlled. She is doing well from this standpoint.

## 2013-03-31 NOTE — Patient Instructions (Addendum)
Your physician recommends that you schedule a follow-up appointment in: 3-4 months. Your physician recommends that you continue on your current medications as directed. Please refer to the Current Medication list given to you today. 

## 2013-04-01 ENCOUNTER — Encounter: Payer: Self-pay | Admitting: Internal Medicine

## 2013-04-02 ENCOUNTER — Encounter: Payer: Self-pay | Admitting: *Deleted

## 2013-04-08 ENCOUNTER — Other Ambulatory Visit: Payer: Self-pay | Admitting: Internal Medicine

## 2013-04-08 LAB — BASIC METABOLIC PANEL
BUN: 20 mg/dL (ref 6–23)
CALCIUM: 9.6 mg/dL (ref 8.4–10.5)
CO2: 39 mEq/L — ABNORMAL HIGH (ref 19–32)
Chloride: 97 mEq/L (ref 96–112)
Creat: 0.82 mg/dL (ref 0.50–1.10)
GLUCOSE: 133 mg/dL — AB (ref 70–99)
Potassium: 4.3 mEq/L (ref 3.5–5.3)
SODIUM: 143 meq/L (ref 135–145)

## 2013-04-09 LAB — BRAIN NATRIURETIC PEPTIDE: BRAIN NATRIURETIC PEPTIDE: 85.9 pg/mL (ref 0.0–100.0)

## 2013-04-10 ENCOUNTER — Other Ambulatory Visit: Payer: Self-pay | Admitting: *Deleted

## 2013-04-10 DIAGNOSIS — I10 Essential (primary) hypertension: Secondary | ICD-10-CM

## 2013-04-10 DIAGNOSIS — R0602 Shortness of breath: Secondary | ICD-10-CM

## 2013-04-11 ENCOUNTER — Encounter: Payer: Self-pay | Admitting: Internal Medicine

## 2013-04-11 ENCOUNTER — Telehealth: Payer: Self-pay | Admitting: Adult Health

## 2013-04-11 NOTE — Telephone Encounter (Signed)
Please return call to patient's daughter regarding clarification on patient's meds / tgs

## 2013-04-11 NOTE — Telephone Encounter (Signed)
.  left message to have patient return my call.  

## 2013-04-11 NOTE — Telephone Encounter (Signed)
Called pt to clarify and noted the pt sent another email that again went to the [place] office pool, noted [place], pt was then advised the incorrect information via email, the pt needs to take the Lasix as follows per recent lab results:  Notes Recorded by Pricilla RifflePaula V Ross, MD on 04/10/2013 at 1:47 PM Confirm lasix dose (?40 bid alternating with 40 qd ??) If this is what she is taking, labs are better but probably still a little dry. Recommend lasix with K 1x per day. Only twice a day 2x per wk. (Lasix and K) BMET and BNP in 2 wks ------  Pt understood to take the Lasix 40mg  once daily with the potassium except for twice a week when she will increase the dosage to 40mg  twice daily with the potassium, pt was given example to take 40mg  ONCE daily everyday except on tuesdays and thursdays, pt made aware this is just for example, pt understood apologized again for incorrect information, pt daughter [name] understood the instructions and will implement, Pt will call back to our office with any further concerns if necessary

## 2013-04-24 LAB — BASIC METABOLIC PANEL
BUN: 15 mg/dL (ref 6–23)
CO2: 38 mEq/L — ABNORMAL HIGH (ref 19–32)
CREATININE: 0.82 mg/dL (ref 0.50–1.10)
Calcium: 9.7 mg/dL (ref 8.4–10.5)
Chloride: 96 mEq/L (ref 96–112)
Glucose, Bld: 129 mg/dL — ABNORMAL HIGH (ref 70–99)
POTASSIUM: 4.5 meq/L (ref 3.5–5.3)
Sodium: 142 mEq/L (ref 135–145)

## 2013-04-24 LAB — BRAIN NATRIURETIC PEPTIDE: Brain Natriuretic Peptide: 114.1 pg/mL — ABNORMAL HIGH (ref 0.0–100.0)

## 2013-04-28 ENCOUNTER — Telehealth: Payer: Self-pay

## 2013-04-28 DIAGNOSIS — R0602 Shortness of breath: Secondary | ICD-10-CM

## 2013-04-28 DIAGNOSIS — R062 Wheezing: Secondary | ICD-10-CM

## 2013-04-28 MED ORDER — POTASSIUM CHLORIDE CRYS ER 20 MEQ PO TBCR: 30.0000 meq | EXTENDED_RELEASE_TABLET | Freq: Every day | ORAL | Status: DC

## 2013-04-28 NOTE — Telephone Encounter (Signed)
Received fax refill request  Placed in refill bin.  This is for new script of Potassium CL TB.

## 2013-04-28 NOTE — Telephone Encounter (Signed)
Noted pt had refill sent to [facility] 03-14-13 for potassium, called pt to clarify, pt daughter noted the pt insurance has recently changed therefore they need a new prescription, pt still currently taking Potassium 20MEQ (take 30MEQ (one and a half tablets) BID) new Rx for #270 with 1 refill sent via escribe to [facility] via escribe, pt daughter made aware

## 2013-05-15 ENCOUNTER — Telehealth: Payer: Self-pay | Admitting: Adult Health

## 2013-05-15 DIAGNOSIS — R0602 Shortness of breath: Secondary | ICD-10-CM

## 2013-05-15 DIAGNOSIS — I10 Essential (primary) hypertension: Secondary | ICD-10-CM

## 2013-05-15 NOTE — Telephone Encounter (Signed)
Please return call regarding patient's meds / tgs

## 2013-05-15 NOTE — Telephone Encounter (Signed)
Pt daughter noted the pt face is puffy/swelling and not quite as bad as the last OV,pt unable to confirm if the swelling relieves at night, pt daughter confirmed pt is taking Lasix 40mg  once daily 5 days a week and BID two days a week as well as Potassium 20meq once daily,pt daughter denies swelling in any other area, also unable to weigh pt at home, notes BP is in a normal range, pt output is confirmed normal, denies chest pain/dizzyness/SOB, notes pt is slightly congested and holding her hands over her ears as she has in the past and pt daughter was advised at last OV that Lorin PicketKathyrn Lawrence NP noted she may need to have PCP review her dementia medication, advised pt Dr Tenny Crawoss is not in the office today and we will contact her with the next steps more than likely tomorrow however in the event pt sxs worsen to take to the ED, pt daughter understood, please advise next steps

## 2013-05-16 NOTE — Telephone Encounter (Signed)
Noted my chart update from Danielle Walker daughter to advise the following noted today:  Denver FasterHey Kim, 05/16/13 I've checked with everyone & they are saying that it's not consistent. Mom's Face is Not swollen all the time, but it has been so since she has been to see Dr. Tenny Crawoss. It's off/on plus she seems to be more thicker through the abdomen,we don't know her weight. Her appetite is usually good, Blood pressure is Normal. She appears more tired. She has wheezing upon arising in morning.   Please advise next steps for this Danielle Walker

## 2013-05-20 NOTE — Telephone Encounter (Signed)
I saw patient in [facility] once.  [facility] Would check BMET and BNP  She should f/u up there.

## 2013-05-21 NOTE — Telephone Encounter (Signed)
Spoke with pt's emergency contact Carolanne GrumblingBeth Robertson. Pt will get labs next week sometime. Once resulted we will see if pt needs to see Dr Purvis SheffieldKoneswaran sooner. Pt already has appt for 3/28, might need to be rescheduled sooner.

## 2013-05-26 ENCOUNTER — Emergency Department (HOSPITAL_COMMUNITY): Payer: Medicare Other

## 2013-05-26 ENCOUNTER — Encounter (HOSPITAL_COMMUNITY): Payer: Self-pay | Admitting: Emergency Medicine

## 2013-05-26 ENCOUNTER — Inpatient Hospital Stay (HOSPITAL_COMMUNITY)
Admission: EM | Admit: 2013-05-26 | Discharge: 2013-06-25 | DRG: 291 | Disposition: E | Payer: Medicare Other | Attending: Family Medicine | Admitting: Family Medicine

## 2013-05-26 DIAGNOSIS — Z79899 Other long term (current) drug therapy: Secondary | ICD-10-CM

## 2013-05-26 DIAGNOSIS — T17408A Unspecified foreign body in trachea causing other injury, initial encounter: Secondary | ICD-10-CM | POA: Diagnosis not present

## 2013-05-26 DIAGNOSIS — Z8249 Family history of ischemic heart disease and other diseases of the circulatory system: Secondary | ICD-10-CM

## 2013-05-26 DIAGNOSIS — T17508A Unspecified foreign body in bronchus causing other injury, initial encounter: Secondary | ICD-10-CM

## 2013-05-26 DIAGNOSIS — E872 Acidosis, unspecified: Secondary | ICD-10-CM | POA: Diagnosis not present

## 2013-05-26 DIAGNOSIS — T17808A Unspecified foreign body in other parts of respiratory tract causing other injury, initial encounter: Secondary | ICD-10-CM

## 2013-05-26 DIAGNOSIS — J9620 Acute and chronic respiratory failure, unspecified whether with hypoxia or hypercapnia: Secondary | ICD-10-CM | POA: Diagnosis present

## 2013-05-26 DIAGNOSIS — D696 Thrombocytopenia, unspecified: Secondary | ICD-10-CM | POA: Diagnosis present

## 2013-05-26 DIAGNOSIS — I5032 Chronic diastolic (congestive) heart failure: Secondary | ICD-10-CM | POA: Diagnosis present

## 2013-05-26 DIAGNOSIS — I2789 Other specified pulmonary heart diseases: Secondary | ICD-10-CM | POA: Diagnosis present

## 2013-05-26 DIAGNOSIS — D649 Anemia, unspecified: Secondary | ICD-10-CM | POA: Diagnosis present

## 2013-05-26 DIAGNOSIS — E785 Hyperlipidemia, unspecified: Secondary | ICD-10-CM | POA: Diagnosis present

## 2013-05-26 DIAGNOSIS — J9819 Other pulmonary collapse: Secondary | ICD-10-CM | POA: Diagnosis present

## 2013-05-26 DIAGNOSIS — S82409A Unspecified fracture of shaft of unspecified fibula, initial encounter for closed fracture: Secondary | ICD-10-CM | POA: Diagnosis present

## 2013-05-26 DIAGNOSIS — S82143A Displaced bicondylar fracture of unspecified tibia, initial encounter for closed fracture: Secondary | ICD-10-CM | POA: Diagnosis present

## 2013-05-26 DIAGNOSIS — S82832A Other fracture of upper and lower end of left fibula, initial encounter for closed fracture: Secondary | ICD-10-CM

## 2013-05-26 DIAGNOSIS — E860 Dehydration: Secondary | ICD-10-CM | POA: Diagnosis present

## 2013-05-26 DIAGNOSIS — F0390 Unspecified dementia without behavioral disturbance: Secondary | ICD-10-CM | POA: Diagnosis present

## 2013-05-26 DIAGNOSIS — I959 Hypotension, unspecified: Secondary | ICD-10-CM | POA: Diagnosis present

## 2013-05-26 DIAGNOSIS — IMO0002 Reserved for concepts with insufficient information to code with codable children: Secondary | ICD-10-CM | POA: Diagnosis not present

## 2013-05-26 DIAGNOSIS — W19XXXA Unspecified fall, initial encounter: Secondary | ICD-10-CM | POA: Diagnosis present

## 2013-05-26 DIAGNOSIS — Z87891 Personal history of nicotine dependence: Secondary | ICD-10-CM

## 2013-05-26 DIAGNOSIS — R1907 Generalized intra-abdominal and pelvic swelling, mass and lump: Secondary | ICD-10-CM | POA: Diagnosis present

## 2013-05-26 DIAGNOSIS — J9811 Atelectasis: Secondary | ICD-10-CM

## 2013-05-26 DIAGNOSIS — I509 Heart failure, unspecified: Secondary | ICD-10-CM

## 2013-05-26 DIAGNOSIS — Z66 Do not resuscitate: Secondary | ICD-10-CM | POA: Diagnosis present

## 2013-05-26 DIAGNOSIS — Z7982 Long term (current) use of aspirin: Secondary | ICD-10-CM

## 2013-05-26 DIAGNOSIS — I5033 Acute on chronic diastolic (congestive) heart failure: Principal | ICD-10-CM | POA: Diagnosis present

## 2013-05-26 DIAGNOSIS — Z9981 Dependence on supplemental oxygen: Secondary | ICD-10-CM

## 2013-05-26 DIAGNOSIS — I10 Essential (primary) hypertension: Secondary | ICD-10-CM | POA: Diagnosis present

## 2013-05-26 DIAGNOSIS — I4891 Unspecified atrial fibrillation: Secondary | ICD-10-CM | POA: Diagnosis present

## 2013-05-26 DIAGNOSIS — J9600 Acute respiratory failure, unspecified whether with hypoxia or hypercapnia: Secondary | ICD-10-CM

## 2013-05-26 DIAGNOSIS — K219 Gastro-esophageal reflux disease without esophagitis: Secondary | ICD-10-CM | POA: Diagnosis present

## 2013-05-26 DIAGNOSIS — I482 Chronic atrial fibrillation, unspecified: Secondary | ICD-10-CM | POA: Diagnosis present

## 2013-05-26 DIAGNOSIS — S82209A Unspecified fracture of shaft of unspecified tibia, initial encounter for closed fracture: Secondary | ICD-10-CM | POA: Diagnosis present

## 2013-05-26 DIAGNOSIS — N179 Acute kidney failure, unspecified: Secondary | ICD-10-CM | POA: Diagnosis present

## 2013-05-26 DIAGNOSIS — I4892 Unspecified atrial flutter: Secondary | ICD-10-CM

## 2013-05-26 DIAGNOSIS — S82202A Unspecified fracture of shaft of left tibia, initial encounter for closed fracture: Secondary | ICD-10-CM

## 2013-05-26 DIAGNOSIS — M810 Age-related osteoporosis without current pathological fracture: Secondary | ICD-10-CM | POA: Diagnosis present

## 2013-05-26 LAB — BASIC METABOLIC PANEL
BUN: 19 mg/dL (ref 6–23)
CALCIUM: 9.1 mg/dL (ref 8.4–10.5)
CHLORIDE: 98 meq/L (ref 96–112)
CO2: 39 mEq/L — ABNORMAL HIGH (ref 19–32)
Creatinine, Ser: 0.75 mg/dL (ref 0.50–1.10)
GFR calc Af Amer: 86 mL/min — ABNORMAL LOW (ref >90)
GFR calc non Af Amer: 74 mL/min — ABNORMAL LOW (ref >90)
GLUCOSE: 172 mg/dL — AB (ref 70–99)
POTASSIUM: 4.8 meq/L (ref 3.7–5.3)
SODIUM: 143 meq/L (ref 137–147)

## 2013-05-26 LAB — CBC WITH DIFFERENTIAL/PLATELET
BASOS ABS: 0 10*3/uL (ref 0.0–0.1)
Basophils Relative: 0 % (ref 0–1)
Eosinophils Absolute: 0.2 10*3/uL (ref 0.0–0.7)
Eosinophils Relative: 3 % (ref 0–5)
HCT: 35.2 % — ABNORMAL LOW (ref 36.0–46.0)
Hemoglobin: 10.4 g/dL — ABNORMAL LOW (ref 12.0–15.0)
Lymphocytes Relative: 10 % — ABNORMAL LOW (ref 12–46)
Lymphs Abs: 0.7 10*3/uL (ref 0.7–4.0)
MCH: 30.8 pg (ref 26.0–34.0)
MCHC: 29.5 g/dL — ABNORMAL LOW (ref 30.0–36.0)
MCV: 104.1 fL — ABNORMAL HIGH (ref 78.0–100.0)
Monocytes Absolute: 0.7 10*3/uL (ref 0.1–1.0)
Monocytes Relative: 11 % (ref 3–12)
NEUTROS ABS: 4.9 10*3/uL (ref 1.7–7.7)
Neutrophils Relative %: 76 % (ref 43–77)
PLATELETS: 86 10*3/uL — AB (ref 150–400)
RBC: 3.38 MIL/uL — ABNORMAL LOW (ref 3.87–5.11)
RDW: 15.2 % (ref 11.5–15.5)
WBC: 6.5 10*3/uL (ref 4.0–10.5)

## 2013-05-26 LAB — TROPONIN I

## 2013-05-26 LAB — PRO B NATRIURETIC PEPTIDE: PRO B NATRI PEPTIDE: 3045 pg/mL — AB (ref 0–450)

## 2013-05-26 MED ORDER — FUROSEMIDE 10 MG/ML IJ SOLN
40.0000 mg | Freq: Once | INTRAMUSCULAR | Status: AC
  Administered 2013-05-26: 40 mg via INTRAVENOUS
  Filled 2013-05-26: qty 4

## 2013-05-26 MED ORDER — FAMOTIDINE 20 MG PO TABS
10.0000 mg | ORAL_TABLET | Freq: Every day | ORAL | Status: DC
  Administered 2013-05-27 – 2013-05-29 (×3): 10 mg via ORAL
  Filled 2013-05-26 (×3): qty 1

## 2013-05-26 MED ORDER — POTASSIUM CHLORIDE CRYS ER 20 MEQ PO TBCR
20.0000 meq | EXTENDED_RELEASE_TABLET | Freq: Two times a day (BID) | ORAL | Status: DC
  Administered 2013-05-26 – 2013-05-29 (×6): 20 meq via ORAL
  Filled 2013-05-26 (×6): qty 1

## 2013-05-26 MED ORDER — SODIUM CHLORIDE 0.9 % IJ SOLN
3.0000 mL | Freq: Two times a day (BID) | INTRAMUSCULAR | Status: DC
  Administered 2013-05-26 – 2013-05-29 (×6): 3 mL via INTRAVENOUS

## 2013-05-26 MED ORDER — ONDANSETRON HCL 4 MG/2ML IJ SOLN: 4.0000 mg | Freq: Four times a day (QID) | INTRAMUSCULAR | Status: DC | PRN

## 2013-05-26 MED ORDER — MORPHINE SULFATE 2 MG/ML IJ SOLN: 2.0000 mg | INTRAMUSCULAR | Status: DC | PRN

## 2013-05-26 MED ORDER — ASPIRIN EC 81 MG PO TBEC
81.0000 mg | DELAYED_RELEASE_TABLET | Freq: Every day | ORAL | Status: DC
  Administered 2013-05-27 – 2013-05-29 (×3): 81 mg via ORAL
  Filled 2013-05-26 (×3): qty 1

## 2013-05-26 MED ORDER — ENOXAPARIN SODIUM 40 MG/0.4ML ~~LOC~~ SOLN
40.0000 mg | SUBCUTANEOUS | Status: DC
  Administered 2013-05-26 – 2013-05-28 (×3): 40 mg via SUBCUTANEOUS
  Filled 2013-05-26 (×3): qty 0.4

## 2013-05-26 MED ORDER — HYDROCODONE-ACETAMINOPHEN 5-325 MG PO TABS
1.0000 | ORAL_TABLET | ORAL | Status: DC | PRN
  Administered 2013-05-28 – 2013-05-29 (×2): 1 via ORAL
  Filled 2013-05-26 (×3): qty 1

## 2013-05-26 MED ORDER — LORATADINE 10 MG PO TABS
10.0000 mg | ORAL_TABLET | Freq: Every day | ORAL | Status: DC
  Administered 2013-05-27 – 2013-05-29 (×3): 10 mg via ORAL
  Filled 2013-05-26 (×3): qty 1

## 2013-05-26 MED ORDER — ONDANSETRON HCL 4 MG PO TABS: 4.0000 mg | ORAL_TABLET | Freq: Four times a day (QID) | ORAL | Status: DC | PRN

## 2013-05-26 MED ORDER — TRAZODONE HCL 50 MG PO TABS
50.0000 mg | ORAL_TABLET | Freq: Every day | ORAL | Status: DC
  Administered 2013-05-26 – 2013-05-29 (×4): 50 mg via ORAL
  Filled 2013-05-26 (×4): qty 1

## 2013-05-26 MED ORDER — ONDANSETRON HCL 4 MG/2ML IJ SOLN: 4.0000 mg | Freq: Three times a day (TID) | INTRAMUSCULAR | Status: DC | PRN

## 2013-05-26 MED ORDER — DONEPEZIL HCL 5 MG PO TABS
10.0000 mg | ORAL_TABLET | Freq: Every day | ORAL | Status: DC
  Administered 2013-05-26 – 2013-05-29 (×4): 10 mg via ORAL
  Filled 2013-05-26 (×4): qty 2

## 2013-05-26 MED ORDER — MEMANTINE HCL 10 MG PO TABS
10.0000 mg | ORAL_TABLET | Freq: Two times a day (BID) | ORAL | Status: DC
  Administered 2013-05-26 – 2013-05-29 (×7): 10 mg via ORAL
  Filled 2013-05-26 (×7): qty 1

## 2013-05-26 MED ORDER — FOLIC ACID 1 MG PO TABS
1.0000 mg | ORAL_TABLET | Freq: Every day | ORAL | Status: DC
  Administered 2013-05-27 – 2013-05-29 (×3): 1 mg via ORAL
  Filled 2013-05-26 (×3): qty 1

## 2013-05-26 MED ORDER — RALOXIFENE HCL 60 MG PO TABS
60.0000 mg | ORAL_TABLET | Freq: Every day | ORAL | Status: DC
  Administered 2013-05-27 – 2013-05-29 (×3): 60 mg via ORAL
  Filled 2013-05-26 (×3): qty 1

## 2013-05-26 MED ORDER — MUPIROCIN 2 % EX OINT: 1.0000 "application " | TOPICAL_OINTMENT | Freq: Every day | CUTANEOUS | Status: DC | PRN

## 2013-05-26 MED ORDER — CLOTRIMAZOLE 1 % EX CREA
TOPICAL_CREAM | Freq: Two times a day (BID) | CUTANEOUS | Status: DC
  Administered 2013-05-26 – 2013-05-29 (×6): via TOPICAL
  Administered 2013-05-29: 1 via TOPICAL
  Filled 2013-05-26: qty 15

## 2013-05-26 MED ORDER — FUROSEMIDE 10 MG/ML IJ SOLN
40.0000 mg | Freq: Two times a day (BID) | INTRAMUSCULAR | Status: DC
  Administered 2013-05-26 – 2013-05-28 (×5): 40 mg via INTRAVENOUS
  Filled 2013-05-26 (×5): qty 4

## 2013-05-26 MED ORDER — LISINOPRIL 10 MG PO TABS
10.0000 mg | ORAL_TABLET | Freq: Every day | ORAL | Status: DC
  Administered 2013-05-27 – 2013-05-29 (×3): 10 mg via ORAL
  Filled 2013-05-26 (×3): qty 1

## 2013-05-26 NOTE — H&P (Signed)
[facility] History and Physical  Danielle Walker ZOX:[id number] DOB: May 31, 1923 DOA: 06-12-2013  Referring physician:  PCP: Kirk Ruths, MD  Specialists:   Chief Complaint: SOB, fall   HPI: Danielle Walker is a 80 y.o. female with PMH of CHF, HTN, HPL, A FIb not on AC (fall, bleeding), advanced dementia, anemia presented progressive SOB, PND, orthopnea for two weeks, associated with worsening swelling of her legs, abdomen, face with cough and congestion. She had an episode today when they were trying to take the patient to her primary care physician's office where she slid to the ground and was unable to get up xray showed possible fracture fibula, tibia    Review of Systems: The patient denies anorexia, fever, weight loss,, vision loss, decreased hearing, hoarseness, chest pain, syncope, dyspnea on exertion, peripheral edema, balance deficits, hemoptysis, abdominal pain, melena, hematochezia, severe indigestion/heartburn, hematuria, incontinence, genital sores, muscle weakness, suspicious skin lesions, transient blindness, difficulty walking, depression, unusual weight change, abnormal bleeding, enlarged lymph nodes, angioedema, and breast masses.    Past Medical History  Diagnosis Date  . Arrhythmia   . CHF (congestive heart failure)   . Dementia   . Osteoporosis   . Hypertension   . GERD (gastroesophageal reflux disease)   . Anemia   . Blood transfusion    Past Surgical History  Procedure Laterality Date  . Hip fracture surgery    . Appendectomy     Social History:  reports that she has quit smoking. She has never used smokeless tobacco. She reports that she does not drink alcohol or use illicit drugs. Home;  where does patient live--home, ALF, SNF? and with whom if at home? No;  Can patient participate in ADLs?  Allergies  Allergen Reactions  . Penicillins Hives  . Sulfa Antibiotics Itching    Family History  Problem Relation Age of Onset  . Heart disease  Mother   . Heart disease Father     (be sure to complete)  Prior to Admission medications   Medication Sig Start Date End Date Taking? Authorizing Provider  acetaminophen (TYLENOL) 650 MG CR tablet Take 650 mg by mouth at bedtime.   Yes Historical Provider, MD  aspirin 81 MG tablet Take 81 mg by mouth daily.    Yes Historical Provider, MD  cetirizine (ZYRTEC) 10 MG tablet Take 10 mg by mouth daily.   Yes Historical Provider, MD  donepezil (ARICEPT) 10 MG tablet Take 10 mg by mouth daily.    Yes Historical Provider, MD  folic acid (FOLVITE) 1 MG tablet Take 1 mg by mouth daily.     Yes Historical Provider, MD  furosemide (LASIX) 20 MG tablet Take 20 mg by mouth 2 (two) times daily.   Yes Historical Provider, MD  lisinopril (PRINIVIL,ZESTRIL) 10 MG tablet Take 1 tablet (10 mg total) by mouth daily. 04/10/12 06/12/2013 Yes Dyann Kief, PA-C  memantine (NAMENDA) 10 MG tablet Take 10 mg by mouth 2 (two) times daily.    Yes Historical Provider, MD  potassium chloride SA (K-DUR,KLOR-CON) 20 MEQ tablet Take 20 mEq by mouth 2 (two) times daily.   Yes Historical Provider, MD  raloxifene (EVISTA) 60 MG tablet Take 60 mg by mouth daily.     Yes Historical Provider, MD  ranitidine (ZANTAC) 150 MG tablet Take 150 mg by mouth 2 (two) times daily.     Yes Historical Provider, MD  traZODone (DESYREL) 50 MG tablet Take 50 mg by mouth at bedtime.  12/18/12  Yes  Historical Provider, MD  clotrimazole-betamethasone (LOTRISONE) cream 1 application daily as needed (ulcers).  10/17/12   Historical Provider, MD  mupirocin ointment (BACTROBAN) 2 % Apply 1 application topically daily as needed (ulcers).  12/17/12   Historical Provider, MD  triamcinolone cream (KENALOG) 0.1 % 1 application daily as needed (ulcers).  02/10/13   Historical Provider, MD   Physical Exam: Filed Vitals:   06/08/2013 1600  BP: 143/64  Temp:   Resp: 33     General:  Alert,. But confused   Eyes: EOM-i, perrla   ENT: no oral ulcers   Neck:  supple , + JVD  Cardiovascular: s1,s2 irregular   Respiratory: BL crackles  Abdomen: soft, distended, nt  Skin: echymosis   Musculoskeletal: mild LE edema chronic   Psychiatric: no hallucinations   Neurologic: CN 2-12 intact; confused, motor R leg ROM limited due to pain   Labs on Admission:  Basic Metabolic Panel:  Recent Labs Lab 06/23/2013 1245  NA 143  K 4.8  CL 98  CO2 39*  GLUCOSE 172*  BUN 19  CREATININE 0.75  CALCIUM 9.1   Liver Function Tests: No results found for this basename: AST, ALT, ALKPHOS, BILITOT, PROT, ALBUMIN,  in the last 168 hours No results found for this basename: LIPASE, AMYLASE,  in the last 168 hours No results found for this basename: AMMONIA,  in the last 168 hours CBC:  Recent Labs Lab 05/27/2013 1245  WBC 6.5  NEUTROABS 4.9  HGB 10.4*  HCT 35.2*  MCV 104.1*  PLT 86*   Cardiac Enzymes:  Recent Labs Lab 06/12/2013 1245  TROPONINI <0.30    BNP (last 3 results)  Recent Labs  03/17/13 1049 03/21/13 1213 05/29/2013 1245  PROBNP 3530.0* 1450* 3045.0*   CBG: No results found for this basename: GLUCAP,  in the last 168 hours  Radiological Exams on Admission: Dg Hip Complete Left  06/23/2013   CLINICAL DATA:  Fall.  EXAM: LEFT HIP - COMPLETE 2+ VIEW  COMPARISON:  None.  FINDINGS: Evidence of prior internal fixation in the proximal right femur. No acute bony abnormality. No acute fracture, subluxation or dislocation. Mild osteopenia. SI joints are symmetric and unremarkable.  IMPRESSION: No acute bony abnormality.  Osteopenia.   Electronically Signed   By: Charlett Nose M.D.   On: 06/07/2013 14:04   Ct Knee Left Wo Contrast  06/06/2013   CLINICAL DATA:  Left knee pain status post fall  EXAM: CT OF THE LEFT KNEE WITHOUT CONTRAST  TECHNIQUE: Multidetector CT imaging was performed according to the standard protocol. Multiplanar CT image reconstructions were also generated.  COMPARISON:  None.  FINDINGS: There is a nondisplaced fracture of  the left tibial metaphysis.  There is a mildly comminuted, oblique fracture of the proximal fibular metaphysis and diaphysis.  There is generalized osteopenia. There is a large lipohemarthrosis. There is no other fracture or dislocation. There are mild tricompartmental osteoarthritic changes. There is no fluid collection or hematoma.  There is peripheral vascular atherosclerotic disease.  IMPRESSION: 1. Nondisplaced fracture of the left proximal tibial metaphysis. 2. Mildly comminuted, nondisplaced proximal fibular metaphysis and diaphysis fracture.   Electronically Signed   By: Elige Ko   On: 06/20/2013 15:32   Dg Chest Portable 1 View  06/19/2013   CLINICAL DATA:  History of fall.  Weakness.  Confusion.  EXAM: PORTABLE CHEST - 1 VIEW  COMPARISON:  Chest x-ray 03/17/2013.  FINDINGS: Lung volumes are low. Film is underpenetrated secondary to the patient's large  body habitus (this limits the diagnostic sensitivity and specificity of this examination). With these limitations in mind, the opacity at the base of the left hemithorax is favored to be technique related. There is cephalization of the pulmonary vasculature with indistinct interstitial markings diffusely, concerning for mild interstitial pulmonary edema. Mild cardiomegaly is unchanged. The patient is rotated to the left on today's exam, resulting in distortion of the mediastinal contours and reduced diagnostic sensitivity and specificity for mediastinal pathology. Atherosclerosis in the thoracic aorta.  IMPRESSION: 1. Findings, as above, most compatible with mild congestive heart failure.   Electronically Signed   By: Trudie Reedaniel  Entrikin M.D.   On: 11/29/13 13:36   Dg Knee Complete 4 Views Left  06/09/2013   CLINICAL DATA:  Status post fall. Patient reportedly denies knee pain.  EXAM: LEFT KNEE - COMPLETE 4+ VIEW  COMPARISON:  10/26/2010.  FINDINGS: The bones are diffusely demineralized. There is stable moderate tricompartmental joint space loss. The  lateral view demonstrates a moderate-sized joint effusion with a probable fat fluid level indicating a lipohemarthrosis. There is a mildly displaced fracture of the fibular neck. There is a possible fracture involving the medial cortex of the proximal tibia. No depressed tibial plateau fracture is identified. The distal femur appears intact. Multiple soft tissue calcifications are noted.  IMPRESSION: Acute mildly displaced fracture of the fibular neck with possible fracture of the medial tibial metaphysis. Intra-articular extension of a fracture is implied by the presence of a lipohemarthrosis. Consider CT for further evaluation.   Electronically Signed   By: Roxy HorsemanBill  Veazey M.D.   On: 11/29/13 14:00    EKG: Independently reviewed. A fib   Assessment/Plan Principal Problem:   CHF exacerbation Active Problems:   Generalized abdominal swelling   Chronic atrial fibrillation   Diastolic CHF, chronic   HTN (hypertension), benign   Fall   Fibula fracture   Dementia   80 y.o. female with PMH of CHF, HTN, HPL, A FIb not on AC (fall, bleeding), advanced dementia, anemia presented progressive SOB, PND, orthopnea for two weeks admitted with CHF, fall and fracture   1. Acute on chronic CHF ? A fib related; CXR: pulmonary edema;  Echo (2012): LVEF 60%, dilated cardiomyopathy, pulmonary HTN  -started IV lasix; daily weight, I/O; check echo; cont ASA, ACE, not on BB  2. Fall with Nondisplaced fracture of the left proximal tibial metaphysis, proximal fibular  -cont pain control, ortho consult  3. A fib not on AC (fall, bleeding risk); HR stable; -cont ASA; monitor on tele; not on BB, if HR uncontrolled consider BB   4. Dementia; cont home meds;     Ortho;  if consultant consulted, please document name and whether formally or informally consulted  Code Status: DNR (must indicate code status--if unknown or must be presumed, indicate so) Family Communication: d/w patient, daughters at the bedside  (indicate person spoken with, if applicable, with phone number if by telephone) Disposition Plan: home when ready  (indicate anticipated LOS)  Time spent: >35 minutes   Esperanza SheetsBURIEV, Aneliese Beaudry N [facility] Pager [phone]  If 7PM-7AM, please contact night-coverage [email or web address] Password [email or web address] 06/11/2013, 4:10 PM

## 2013-05-26 NOTE — ED Notes (Signed)
Patient arrives via EMS with c/o fall. Patient was getting into vehicle when she became weak and slid to the ground. No c/o pain. Patient alert/confused. Patient was on way to MD office to be checked out for "fluid". CHF patient.

## 2013-05-26 NOTE — ED Provider Notes (Signed)
TIME SEEN: 1:15 PM  CHIEF COMPLAINT: Cough, congestion, shortness of breath; fall today with left knee pain  HPI: Patient is a 80 year old female with a history of hypertension, atrial fibrillation no longer on Coumadin, CHF, dementia who lives with her daughter who presents to the emergency department with 2 weeks of progressively worsening swelling of her legs, abdomen, face with cough and congestion.  Daughter reports she is on 2 L of oxygen at home and has had increased to 3 L recently.  She had an episode today when they were trying to take the patient to her primary care physician's office where she slid to the ground and was unable to get up.  He states she did not hit her head with her left leg did go underneath her abnormally and she has not been able to walk since.  ROS: Level V caveat for dementia  PAST MEDICAL HISTORY/PAST SURGICAL HISTORY:  Past Medical History  Diagnosis Date  . Arrhythmia   . CHF (congestive heart failure)   . Dementia   . Osteoporosis   . Hypertension   . GERD (gastroesophageal reflux disease)   . Anemia   . Blood transfusion     MEDICATIONS:  Prior to Admission medications   Medication Sig Start Date End Date Taking? Authorizing Provider  acetaminophen (TYLENOL) 650 MG CR tablet Take 650 mg by mouth at bedtime.   Yes Historical Provider, MD  aspirin 81 MG tablet Take 81 mg by mouth daily.    Yes Historical Provider, MD  cetirizine (ZYRTEC) 10 MG tablet Take 10 mg by mouth daily.   Yes Historical Provider, MD  donepezil (ARICEPT) 10 MG tablet Take 10 mg by mouth daily.    Yes Historical Provider, MD  folic acid (FOLVITE) 1 MG tablet Take 1 mg by mouth daily.     Yes Historical Provider, MD  furosemide (LASIX) 20 MG tablet Take 20 mg by mouth 2 (two) times daily.   Yes Historical Provider, MD  lisinopril (PRINIVIL,ZESTRIL) 10 MG tablet Take 1 tablet (10 mg total) by mouth daily. 04/10/12 2013-06-07 Yes Dyann Kief, PA-C  memantine (NAMENDA) 10 MG tablet  Take 10 mg by mouth 2 (two) times daily.    Yes Historical Provider, MD  potassium chloride SA (K-DUR,KLOR-CON) 20 MEQ tablet Take 20 mEq by mouth 2 (two) times daily.   Yes Historical Provider, MD  raloxifene (EVISTA) 60 MG tablet Take 60 mg by mouth daily.     Yes Historical Provider, MD  ranitidine (ZANTAC) 150 MG tablet Take 150 mg by mouth 2 (two) times daily.     Yes Historical Provider, MD  traZODone (DESYREL) 50 MG tablet Take 50 mg by mouth at bedtime.  12/18/12  Yes Historical Provider, MD  clotrimazole-betamethasone (LOTRISONE) cream 1 application daily as needed (ulcers).  10/17/12   Historical Provider, MD  mupirocin ointment (BACTROBAN) 2 % Apply 1 application topically daily as needed (ulcers).  12/17/12   Historical Provider, MD  triamcinolone cream (KENALOG) 0.1 % 1 application daily as needed (ulcers).  02/10/13   Historical Provider, MD    ALLERGIES:  Allergies  Allergen Reactions  . Penicillins Hives  . Sulfa Antibiotics Itching    SOCIAL HISTORY:  History  Substance Use Topics  . Smoking status: Former Games developer  . Smokeless tobacco: Never Used  . Alcohol Use: No    FAMILY HISTORY: Family History  Problem Relation Age of Onset  . Heart disease Mother   . Heart disease Father  EXAM: BP 130/44  Temp(Src) 97.9 F (36.6 C) (Oral)  Resp 22  Ht 5\' 10"  (1.778 m)  Wt 230 lb (104.327 kg)  BMI 33.00 kg/m2  SpO2 90% CONSTITUTIONAL: Alert and oriented to person only and responds appropriately to questions intermittently; elderly, in no apparent distress HEAD: Normocephalic EYES: Conjunctivae clear, PERRL ENT: normal nose; no rhinorrhea; moist mucous membranes; pharynx without lesions noted NECK: Supple, no meningismus, no LAD  CARD: Irregularly irregular; S1 and S2 appreciated; no murmurs, no clicks, no rubs, no gallops RESP: Normal chest excursion without splinting or tachypnea; breath sounds equal bilaterally, diffuse expiratory wheeze and rales at her bases, no  rhonchi ABD/GI: Normal bowel sounds; non-distended; soft, non-tender, no rebound, no guarding BACK:  The back appears normal and is non-tender to palpation, there is no CVA tenderness EXT: Patient is tender to palpation over her left lateral hip and left knee diffusely with small joint effusion, otherwise Normal ROM in all joints; non-tender to palpation; no edema; normal capillary refill; no cyanosis    SKIN: Normal color for age and race; warm NEURO: Moves all extremities equally, cranial nerves II through XII intact, sensation to light touch intact diffusely PSYCH: The patient's mood and manner are appropriate. Grooming and personal hygiene are appropriate.  MEDICAL DECISION MAKING: Patient here with worsening CHF exacerbation.  Oxygen requirement has increased.  She is denying any chest pain.  Will obtain cardiac labs.  EKG is unchanged.  We'll give IV Lasix.  We'll also obtain x-rays of her left hip and knee due to her fall today.  She did not hit her head.  She is at her neurologic baseline.   ED PROGRESS: Patient's labs show elevated BNP greater than 3000.  Troponin is negative.  Chest x-ray concerns diffuse pulmonary edema.  X-ray of patient's knee also shows a mildly displaced fracture of the superior neck with possible fracture of the medial tibial metaphysis with intra-articular extension.  Recommended CT for further evaluation.  Will obtain CT scan.  Patient will need hospitalist admission for her CHF exacerbation.  3:27 PM  Discussed with hospitalist on call for admission to telemetry, and the patient.  We'll also consult orthopedics given patient's fibular head and tibial fractures.  3:45 PM  Spoke with Dr. Romeo AppleHarrison with ortho who will see the patient in consult.  We'll place knee immobilizer on patient.   SPLINT APPLICATION Date/Time: 3:45 PM Authorized by: Raelyn NumberWARD, Offie Waide N Consent: Verbal consent obtained. Risks and benefits: risks, benefits and alternatives were  discussed Consent given by: patient Splint applied by: orthopedic technician Location details: left knee Splint type: Knee immobilizer  Supplies used: Knee immobilizer  Post-procedure: The splinted body part was neurovascularly unchanged following the procedure. Patient tolerance: Patient tolerated the procedure well with no immediate complications.       EKG Interpretation  Date/Time:  Monday May 26 2013 12:35:48 EST Ventricular Rate:  71 PR Interval:    QRS Duration: 96 QT Interval:  396 QTC Calculation: 430 R Axis:   78 Text Interpretation:  Atrial fibrillation Otherwise normal ECG When compared with ECG of 17-Mar-2013 10:32, Criteria for Septal infarct are no longer Present Nonspecific T wave abnormality no longer evident in Anterior leads Confirmed by Starkisha Tullis,  DO, Yates Weisgerber [phone]([id number]) on 06/07/2013 1:02:23 PM        Layla MawKristen N Harlowe Dowler, DO June 08, 2013 1546

## 2013-05-26 NOTE — ED Notes (Signed)
Patient had large BM on arrival to ED. RN and ED tech completed peri-care and changed linens. Patient noted to have shortness of breath and wheezing during linen change. Patient on 3 liters [place] continuously at home. Oxygen sats 92% in room on 3 Liters [place].

## 2013-05-26 NOTE — ED Notes (Signed)
Report given to Tameka, RN.

## 2013-05-26 NOTE — Plan of Care (Signed)
Problem: Phase I Progression Outcomes Goal: Pain controlled with appropriate interventions Outcome: Progressing No pain at the current time.

## 2013-05-27 ENCOUNTER — Encounter (HOSPITAL_COMMUNITY): Payer: Self-pay | Admitting: Orthopedic Surgery

## 2013-05-27 DIAGNOSIS — S82109A Unspecified fracture of upper end of unspecified tibia, initial encounter for closed fracture: Secondary | ICD-10-CM

## 2013-05-27 DIAGNOSIS — I379 Nonrheumatic pulmonary valve disorder, unspecified: Secondary | ICD-10-CM

## 2013-05-27 DIAGNOSIS — J9620 Acute and chronic respiratory failure, unspecified whether with hypoxia or hypercapnia: Secondary | ICD-10-CM

## 2013-05-27 DIAGNOSIS — S82143A Displaced bicondylar fracture of unspecified tibia, initial encounter for closed fracture: Secondary | ICD-10-CM | POA: Diagnosis present

## 2013-05-27 DIAGNOSIS — I5033 Acute on chronic diastolic (congestive) heart failure: Principal | ICD-10-CM

## 2013-05-27 LAB — BASIC METABOLIC PANEL
BUN: 20 mg/dL (ref 6–23)
CO2: 39 mEq/L — ABNORMAL HIGH (ref 19–32)
CREATININE: 0.85 mg/dL (ref 0.50–1.10)
Calcium: 8.8 mg/dL (ref 8.4–10.5)
Chloride: 97 mEq/L (ref 96–112)
GFR calc Af Amer: 69 mL/min — ABNORMAL LOW (ref >90)
GFR calc non Af Amer: 60 mL/min — ABNORMAL LOW (ref >90)
Glucose, Bld: 154 mg/dL — ABNORMAL HIGH (ref 70–99)
Potassium: 4.4 mEq/L (ref 3.7–5.3)
SODIUM: 145 meq/L (ref 137–147)

## 2013-05-27 LAB — CBC
HCT: 31.5 % — ABNORMAL LOW (ref 36.0–46.0)
Hemoglobin: 9.4 g/dL — ABNORMAL LOW (ref 12.0–15.0)
MCH: 30.4 pg (ref 26.0–34.0)
MCHC: 29.8 g/dL — ABNORMAL LOW (ref 30.0–36.0)
MCV: 101.9 fL — ABNORMAL HIGH (ref 78.0–100.0)
PLATELETS: 88 10*3/uL — AB (ref 150–400)
RBC: 3.09 MIL/uL — AB (ref 3.87–5.11)
RDW: 15.1 % (ref 11.5–15.5)
WBC: 9.8 10*3/uL (ref 4.0–10.5)

## 2013-05-27 NOTE — Evaluation (Signed)
Physical Therapy Evaluation Patient Details Name: Danielle Walker MRN: [id number] DOB: 24-Apr-1923 Today's Date: 05/27/2013 Time: 4098-1191 PT Time Calculation (min): 71 min  PT Assessment / Plan / Recommendation History of Present Illness  Pt is admitted with a left tibial plateau fx after a fall at home.  She also has acute on chronic CHF.  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  She is ambulatory with a walker and assistance for short distances in the home.  Clinical Impression   Pt was seen for initial eval/tx.  She is very alert and cooperative, able to follow simple directions about 75% of the time.  Her LLE is in a knee immobilizer and per MD note will be transitioned to a hinged knee brace after 6 weeks.  She is to be TTWB left until then.  Pt is totally incontinent and is currently on quite a bit of Lasix so it is impossible to keep her bedding dry.  We will try to use her adult diapers during periods of mobilization, if possible.  She requires maximal to total assist for transfer from bed to chair.  She is 6' tall and weight is 230# per chart.  This makes the use of a lift for transfers at this time a necessity.  We will concentrate on therapeutic exercise supine and/or sitting for generalized strengthening of the RLE and in transfer training to develop ability to stand with a walker.  We will ask nursing service to use the Maxi Lift for transfers in and out of bed.  The use of the [place] Dec Lift will not work because of the requirement of the LLE to be maintained in full extension.  She will require SNF at d/c.    PT Assessment  Patient needs continued PT services    Follow Up Recommendations  SNF       Barriers to Discharge Decreased caregiver support family is unable to provide to amount of support now needed by pt    Equipment Recommendations  None recommended by PT         Frequency Min 3X/week    Precautions / Restrictions  Precautions Precautions: Fall Required Braces or Orthoses: Knee Immobilizer - Left Knee Immobilizer - Left: On at all times Restrictions Weight Bearing Restrictions: Yes LLE Weight Bearing: Touchdown weight bearing         Mobility  Bed Mobility Overal bed mobility: Needs Assistance Bed Mobility: Supine to Sit Supine to sit: Total assist;HOB elevated General bed mobility comments: pt unable to assist in transfer Transfers Overall transfer level: Needs assistance Equipment used: None Transfers: Stand Pivot Transfers Stand pivot transfers: Max assist General transfer comment: attempted to stand pt with a walker but she was unable to do so after multiple attempts    Exercises General Exercises - Lower Extremity Ankle Circles/Pumps: AROM;Both;5 reps;Supine Heel Slides: AROM;Right;5 reps;Supine Hip ABduction/ADduction: AROM;Right;5 reps;Supine   PT Diagnosis: Difficulty walking;Generalized weakness  PT Problem List: Decreased strength;Decreased activity tolerance;Decreased balance;Decreased mobility;Decreased cognition;Decreased knowledge of use of DME;Decreased knowledge of precautions;Cardiopulmonary status limiting activity PT Treatment Interventions: Functional mobility training;Therapeutic exercise     PT Goals(Current goals can be found in the care plan section) Acute Rehab PT Goals Patient Stated Goal: none stated PT Goal Formulation: With patient/family Time For Goal Achievement: 06/10/13 Potential to Achieve Goals: Fair  Visit Information  Last PT Received On: 05/27/13 History of Present Illness: Pt is admitted with a left tibial plateau  fx after a fall at home.  She also has acute on chronic CHF.  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  She is ambulatory with a walker and assistance for short distances in the home.       Prior Functioning  Home Living Family/patient expects to be discharged to:: Skilled nursing  facility Prior Function Level of Independence: Needs assistance Gait / Transfers Assistance Needed: ambulates with walker and assist, assist with transfers ADL's / Homemaking Assistance Needed: assist with all ADLs, able to self feed Communication Communication: No difficulties    Cognition  Cognition Arousal/Alertness: Awake/alert Behavior During Therapy: WFL for tasks assessed/performed Overall Cognitive Status: History of cognitive impairments - at baseline Memory: Decreased short-term memory    Extremity/Trunk Assessment Lower Extremity Assessment Lower Extremity Assessment: LLE deficits/detail;Generalized weakness LLE: Unable to fully assess due to immobilization   Balance Balance Overall balance assessment: History of Falls  End of Session PT - End of Session Equipment Utilized During Treatment: Gait belt Activity Tolerance: Patient tolerated treatment well Patient left: in chair;with call bell/phone within reach;with chair alarm set;with family/visitor present Nurse Communication: Mobility status;Need for lift equipment;Weight bearing status  GP     Konrad PentaBrown, Danielle Walker 05/27/2013, 10:49 AM

## 2013-05-27 NOTE — Progress Notes (Signed)
[facility] PROGRESS NOTE  Danielle Walker:[id number] DOB: 1924-01-23 DOA: 06/07/2013 PCP: Kirk Ruths, MD  Summary:  This is a 80 year old female with past medical history of atrial fibrillation, CHF who had been becoming more short of breath prior to admission. The shortness of breath associated with worsening lower extremity edema. Her family attempted to take her to the primary care physician's office where she had fallen unfortunately suffered a tibial plateau fracture. Patient was admitted to hospital for further treatment  Assessment/Plan: 1. Acute on chronic respiratory failure. Patient is on 2 L of oxygen at home. She's been bumped up to 4 L to hypoxia. Continue to wean down as tolerated  2. Acute on chronic diastolic congestive heart failure. Last ejection fraction of 60% in 2012. Echocardiogram has been repeated with results pending. We'll continue with IV Lasix. Intake and output has not been accurate as recorded since patient did not have a Foley catheter and was unable to get to the restroom. We'll place Foley catheter for accurate intake and output in the setting of aggressive diuresis. 3. Fall with nondisplaced fracture of the left proximal tibial metaphysis, proximal fibula. Patient was seen by orthopedics and it appears to be nonoperative. She is employed in a brace. She'll be serial x-rays and followup with orthopedics. She'll need nursing home placement for physical therapy. 4. Atrial fibrillation, not on anticoagulation due to fall and bleeding risk. Continue aspirin. Heart rate is controlled. 5. Dementia. Stable  Code Status: DO NOT RESUSCITATE Family Communication: Discussed with patient and daughter at the bedside Disposition Plan: Skilled nursing facility placement   Consultants:  Orthopedics  Procedures:  Echo pending  Antibiotics:    HPI/Subjective: Feels a little better. Still short of breath, but feels she is improving. Has pain in left  knee.  Objective: Filed Vitals:   05/27/13 1442  BP: 109/63  Pulse: 88  Temp: 99.2 F (37.3 C)  Resp: 22    Intake/Output Summary (Last 24 hours) at 05/27/13 1736 Last data filed at 05/27/13 1300  Gross per 24 hour  Intake    603 ml  Output      0 ml  Net    603 ml   Filed Weights   06/16/2013 1203 06/14/2013 1832 05/27/13 0519  Weight: 104.327 kg (230 lb) 109.8 kg (242 lb 1 oz) 104.5 kg (230 lb 6.1 oz)    Exam:   General:  NAD  Cardiovascular: S1, S2 irregular  Respiratory: crackles at bases  Abdomen: soft, obese, nt, bs+  Musculoskeletal: 1+ edema b/l   Data Reviewed: Basic Metabolic Panel:  Recent Labs Lab 06/14/2013 1245 05/27/13 0509  NA 143 145  K 4.8 4.4  CL 98 97  CO2 39* 39*  GLUCOSE 172* 154*  BUN 19 20  CREATININE 0.75 0.85  CALCIUM 9.1 8.8   Liver Function Tests: No results found for this basename: AST, ALT, ALKPHOS, BILITOT, PROT, ALBUMIN,  in the last 168 hours No results found for this basename: LIPASE, AMYLASE,  in the last 168 hours No results found for this basename: AMMONIA,  in the last 168 hours CBC:  Recent Labs Lab 06/16/2013 1245 05/27/13 0509  WBC 6.5 9.8  NEUTROABS 4.9  --   HGB 10.4* 9.4*  HCT 35.2* 31.5*  MCV 104.1* 101.9*  PLT 86* 88*   Cardiac Enzymes:  Recent Labs Lab 05/29/2013 1245  TROPONINI <0.30   BNP (last 3 results)  Recent Labs  03/17/13 1049 03/21/13 1213 05/27/2013 1245  PROBNP 3530.0*  1450* 3045.0*   CBG: No results found for this basename: GLUCAP,  in the last 168 hours  No results found for this or any previous visit (from the past 240 hour(s)).   Studies: Dg Hip Complete Left  06/06/2013   CLINICAL DATA:  Fall.  EXAM: LEFT HIP - COMPLETE 2+ VIEW  COMPARISON:  None.  FINDINGS: Evidence of prior internal fixation in the proximal right femur. No acute bony abnormality. No acute fracture, subluxation or dislocation. Mild osteopenia. SI joints are symmetric and unremarkable.  IMPRESSION: No acute  bony abnormality.  Osteopenia.   Electronically Signed   By: Charlett Nose M.D.   On: 06/01/2013 14:04   Ct Knee Left Wo Contrast  06/21/2013   CLINICAL DATA:  Left knee pain status post fall  EXAM: CT OF THE LEFT KNEE WITHOUT CONTRAST  TECHNIQUE: Multidetector CT imaging was performed according to the standard protocol. Multiplanar CT image reconstructions were also generated.  COMPARISON:  None.  FINDINGS: There is a nondisplaced fracture of the left tibial metaphysis.  There is a mildly comminuted, oblique fracture of the proximal fibular metaphysis and diaphysis.  There is generalized osteopenia. There is a large lipohemarthrosis. There is no other fracture or dislocation. There are mild tricompartmental osteoarthritic changes. There is no fluid collection or hematoma.  There is peripheral vascular atherosclerotic disease.  IMPRESSION: 1. Nondisplaced fracture of the left proximal tibial metaphysis. 2. Mildly comminuted, nondisplaced proximal fibular metaphysis and diaphysis fracture.   Electronically Signed   By: Elige Ko   On: 06/12/2013 15:32   Dg Chest Portable 1 View  05/28/2013   CLINICAL DATA:  History of fall.  Weakness.  Confusion.  EXAM: PORTABLE CHEST - 1 VIEW  COMPARISON:  Chest x-ray 03/17/2013.  FINDINGS: Lung volumes are low. Film is underpenetrated secondary to the patient's large body habitus (this limits the diagnostic sensitivity and specificity of this examination). With these limitations in mind, the opacity at the base of the left hemithorax is favored to be technique related. There is cephalization of the pulmonary vasculature with indistinct interstitial markings diffusely, concerning for mild interstitial pulmonary edema. Mild cardiomegaly is unchanged. The patient is rotated to the left on today's exam, resulting in distortion of the mediastinal contours and reduced diagnostic sensitivity and specificity for mediastinal pathology. Atherosclerosis in the thoracic aorta.   IMPRESSION: 1. Findings, as above, most compatible with mild congestive heart failure.   Electronically Signed   By: Trudie Reed M.D.   On: 06/13/2013 13:36   Dg Knee Complete 4 Views Left  06/02/2013   CLINICAL DATA:  Status post fall. Patient reportedly denies knee pain.  EXAM: LEFT KNEE - COMPLETE 4+ VIEW  COMPARISON:  10/26/2010.  FINDINGS: The bones are diffusely demineralized. There is stable moderate tricompartmental joint space loss. The lateral view demonstrates a moderate-sized joint effusion with a probable fat fluid level indicating a lipohemarthrosis. There is a mildly displaced fracture of the fibular neck. There is a possible fracture involving the medial cortex of the proximal tibia. No depressed tibial plateau fracture is identified. The distal femur appears intact. Multiple soft tissue calcifications are noted.  IMPRESSION: Acute mildly displaced fracture of the fibular neck with possible fracture of the medial tibial metaphysis. Intra-articular extension of a fracture is implied by the presence of a lipohemarthrosis. Consider CT for further evaluation.   Electronically Signed   By: Roxy Horseman M.D.   On: 06/09/2013 14:00    Scheduled Meds: . aspirin EC  81  mg Oral Daily  . clotrimazole   Topical BID  . donepezil  10 mg Oral QHS  . enoxaparin (LOVENOX) injection  40 mg Subcutaneous Q24H  . famotidine  10 mg Oral Daily  . folic acid  1 mg Oral Daily  . furosemide  40 mg Intravenous BID  . lisinopril  10 mg Oral Daily  . loratadine  10 mg Oral Daily  . memantine  10 mg Oral BID  . potassium chloride SA  20 mEq Oral BID  . raloxifene  60 mg Oral Daily  . sodium chloride  3 mL Intravenous Q12H  . traZODone  50 mg Oral QHS   Continuous Infusions:   Principal Problem:   Acute on chronic diastolic CHF (congestive heart failure) Active Problems:   Generalized abdominal swelling   Chronic atrial fibrillation   Diastolic CHF, chronic   HTN (hypertension), benign   Fall    Fibula fracture   Dementia   Tibial plateau fracture   Acute-on-chronic respiratory failure    Time spent: 30mins    MEMON,JEHANZEB  [facility] Pager [phone]. If 7PM-7AM, please contact night-coverage at [email or web address], password [email or web address] 05/27/2013, 5:36 PM  LOS: 1 day

## 2013-05-27 NOTE — Progress Notes (Signed)
  Echocardiogram 2D Echocardiogram has been performed.  Margreta JourneyLOMBARDO, Danielle Kliebert 05/27/2013, 5:36 PM

## 2013-05-27 NOTE — Clinical Social Work Placement (Signed)
Clinical Social Work Department CLINICAL SOCIAL WORK PLACEMENT NOTE 05/27/2013  Patient:  Danielle Walker,Danielle Walker  Account Number:  [id number] Admit date:  12-04-2013  Clinical Social Worker:  Derenda FennelKARA Iva Posten, LCSW  Date/time:  05/27/2013 02:08 PM  Clinical Social Work is seeking post-discharge placement for this patient at the following level of care:   SKILLED NURSING   (*CSW will update this form in Epic as items are completed)   05/27/2013  [facility]family provided with [facility]'s list of facilities offering this level of care within the geographic area requested by the patient (or if unable, by the patient's family).  05/27/2013  Patient/family informed of their freedom to choose among providers that offer the needed level of care, that participate in Medicare, Medicaid or managed care program needed by the patient, have an available bed and are willing to accept the patient.  05/27/2013  Patient/family informed of [facility]' ownership interest in [facility], as well as of the fact that they are under no obligation to receive care at this facility.  PASARR submitted to EDS on  PASARR number received from EDS on   FL2 transmitted to all facilities in geographic area requested by pt/family on  05/27/2013 FL2 transmitted to all facilities within larger geographic area on   Patient informed that his/her managed care company has contracts with or will negotiate with  certain facilities, including the following:     Patient/family informed of bed offers received:   Patient chooses bed at  Physician recommends and patient chooses bed at    Patient to be transferred to  on   Patient to be transferred to facility by   The following physician request were entered in Epic:   Additional Comments: pt has existing pasarr number.  Derenda FennelKara Baldwin [place], [place] [phone]

## 2013-05-27 NOTE — Consult Note (Signed)
Reason for Consult: TIBIAL PLATEAU FRACTURE  Referring Physician: DR Jacqlyn Larsen is an 80 y.o. female.  HPI:  PCP: Leonides Grills, MD   Specialists:   Chief Complaint: SOB, fall    HPI: Danielle Walker is a 80 y.o. female with PMH of CHF, HTN, HPL, A FIb not on AC (fall, bleeding), advanced dementia, anemia presented progressive SOB, PND, orthopnea for two weeks, associated with worsening swelling of her legs, abdomen, face with cough and congestion. She had an episode today when they were trying to take the patient to her primary care physician's office where she slid to the ground and was unable to get up xray showed TIBIAL PLATEAU FRACTURE   Review of Systems: The patient denies anorexia, fever, weight loss,, vision loss, decreased hearing, hoarseness, chest pain, syncope, dyspnea on exertion, peripheral edema, balance deficits, hemoptysis, abdominal pain, melena, hematochezia, severe indigestion/heartburn, hematuria, incontinence, genital sores, muscle weakness, suspicious skin lesions, transient blindness, difficulty walking, depression, unusual weight change, abnormal bleeding, enlarged lymph nodes, angioedema, and breast masses.    Past Medical History  Diagnosis Date  . Arrhythmia   . CHF (congestive heart failure)   . Dementia   . Osteoporosis   . Hypertension   . GERD (gastroesophageal reflux disease)   . Anemia   . Blood transfusion     Past Surgical History  Procedure Laterality Date  . Hip fracture surgery    . Appendectomy      Family History  Problem Relation Age of Onset  . Heart disease Mother   . Heart disease Father     Social History:  reports that she has quit smoking. She has never used smokeless tobacco. She reports that she does not drink alcohol or use illicit drugs.  Allergies:  Allergies  Allergen Reactions  . Penicillins Hives  . Sulfa Antibiotics Itching    Medications: I have reviewed the patient's current  medications.  Results for orders placed during the hospital encounter of 06/22/2013 (from the past 48 hour(s))  CBC WITH DIFFERENTIAL     Status: Abnormal   Collection Time    06/12/2013 12:45 PM      Result Value Ref Range   WBC 6.5  4.0 - 10.5 K/uL   RBC 3.38 (*) 3.87 - 5.11 MIL/uL   Hemoglobin 10.4 (*) 12.0 - 15.0 g/dL   HCT 35.2 (*) 36.0 - 46.0 %   MCV 104.1 (*) 78.0 - 100.0 fL   MCH 30.8  26.0 - 34.0 pg   MCHC 29.5 (*) 30.0 - 36.0 g/dL   RDW 15.2  11.5 - 15.5 %   Platelets 86 (*) 150 - 400 K/uL   Comment: SPECIMEN CHECKED FOR CLOTS     PLATELET COUNT CONFIRMED BY SMEAR     LARGE PLATELETS PRESENT   Neutrophils Relative % 76  43 - 77 %   Neutro Abs 4.9  1.7 - 7.7 K/uL   Lymphocytes Relative 10 (*) 12 - 46 %   Lymphs Abs 0.7  0.7 - 4.0 K/uL   Monocytes Relative 11  3 - 12 %   Monocytes Absolute 0.7  0.1 - 1.0 K/uL   Eosinophils Relative 3  0 - 5 %   Eosinophils Absolute 0.2  0.0 - 0.7 K/uL   Basophils Relative 0  0 - 1 %   Basophils Absolute 0.0  0.0 - 0.1 K/uL  BASIC METABOLIC PANEL     Status: Abnormal   Collection Time  06/04/2013 12:45 PM      Result Value Ref Range   Sodium 143  137 - 147 mEq/L   Potassium 4.8  3.7 - 5.3 mEq/L   Chloride 98  96 - 112 mEq/L   CO2 39 (*) 19 - 32 mEq/L   Glucose, Bld 172 (*) 70 - 99 mg/dL   BUN 19  6 - 23 mg/dL   Creatinine, Ser 0.75  0.50 - 1.10 mg/dL   Calcium 9.1  8.4 - 10.5 mg/dL   GFR calc non Af Amer 74 (*) >90 mL/min   GFR calc Af Amer 86 (*) >90 mL/min   Comment: (NOTE)     The eGFR has been calculated using the CKD EPI equation.     This calculation has not been validated in all clinical situations.     eGFR's persistently <90 mL/min signify possible Chronic Kidney     Disease.  PRO B NATRIURETIC PEPTIDE     Status: Abnormal   Collection Time    06/24/2013 12:45 PM      Result Value Ref Range   Pro B Natriuretic peptide (BNP) 3045.0 (*) 0 - 450 pg/mL  TROPONIN I     Status: None   Collection Time    06/09/2013 12:45 PM       Result Value Ref Range   Troponin I <0.30  <0.30 ng/mL   Comment:            Due to the release kinetics of cTnI,     a negative result within the first hours     of the onset of symptoms does not rule out     myocardial infarction with certainty.     If myocardial infarction is still suspected,     repeat the test at appropriate intervals.  BASIC METABOLIC PANEL     Status: Abnormal   Collection Time    05/27/13  5:09 AM      Result Value Ref Range   Sodium 145  137 - 147 mEq/L   Potassium 4.4  3.7 - 5.3 mEq/L   Chloride 97  96 - 112 mEq/L   CO2 39 (*) 19 - 32 mEq/L   Glucose, Bld 154 (*) 70 - 99 mg/dL   BUN 20  6 - 23 mg/dL   Creatinine, Ser 0.85  0.50 - 1.10 mg/dL   Calcium 8.8  8.4 - 10.5 mg/dL   GFR calc non Af Amer 60 (*) >90 mL/min   GFR calc Af Amer 69 (*) >90 mL/min   Comment: (NOTE)     The eGFR has been calculated using the CKD EPI equation.     This calculation has not been validated in all clinical situations.     eGFR's persistently <90 mL/min signify possible Chronic Kidney     Disease.  CBC     Status: Abnormal   Collection Time    05/27/13  5:09 AM      Result Value Ref Range   WBC 9.8  4.0 - 10.5 K/uL   RBC 3.09 (*) 3.87 - 5.11 MIL/uL   Hemoglobin 9.4 (*) 12.0 - 15.0 g/dL   HCT 31.5 (*) 36.0 - 46.0 %   MCV 101.9 (*) 78.0 - 100.0 fL   MCH 30.4  26.0 - 34.0 pg   MCHC 29.8 (*) 30.0 - 36.0 g/dL   RDW 15.1  11.5 - 15.5 %   Platelets 88 (*) 150 - 400 K/uL   Comment: CONSISTENT WITH PREVIOUS RESULT  Dg Hip Complete Left  06/03/2013   CLINICAL DATA:  Fall.  EXAM: LEFT HIP - COMPLETE 2+ VIEW  COMPARISON:  None.  FINDINGS: Evidence of prior internal fixation in the proximal right femur. No acute bony abnormality. No acute fracture, subluxation or dislocation. Mild osteopenia. SI joints are symmetric and unremarkable.  IMPRESSION: No acute bony abnormality.  Osteopenia.   Electronically Signed   By: Rolm Baptise M.D.   On: 06/01/2013 14:04   Ct Knee Left Wo  Contrast  06/07/2013   CLINICAL DATA:  Left knee pain status post fall  EXAM: CT OF THE LEFT KNEE WITHOUT CONTRAST  TECHNIQUE: Multidetector CT imaging was performed according to the standard protocol. Multiplanar CT image reconstructions were also generated.  COMPARISON:  None.  FINDINGS: There is a nondisplaced fracture of the left tibial metaphysis.  There is a mildly comminuted, oblique fracture of the proximal fibular metaphysis and diaphysis.  There is generalized osteopenia. There is a large lipohemarthrosis. There is no other fracture or dislocation. There are mild tricompartmental osteoarthritic changes. There is no fluid collection or hematoma.  There is peripheral vascular atherosclerotic disease.  IMPRESSION: 1. Nondisplaced fracture of the left proximal tibial metaphysis. 2. Mildly comminuted, nondisplaced proximal fibular metaphysis and diaphysis fracture.   Electronically Signed   By: Kathreen Devoid   On: 06/12/2013 15:32   Dg Chest Portable 1 View  06/01/2013   CLINICAL DATA:  History of fall.  Weakness.  Confusion.  EXAM: PORTABLE CHEST - 1 VIEW  COMPARISON:  Chest x-ray 03/17/2013.  FINDINGS: Lung volumes are low. Film is underpenetrated secondary to the patient's large body habitus (this limits the diagnostic sensitivity and specificity of this examination). With these limitations in mind, the opacity at the base of the left hemithorax is favored to be technique related. There is cephalization of the pulmonary vasculature with indistinct interstitial markings diffusely, concerning for mild interstitial pulmonary edema. Mild cardiomegaly is unchanged. The patient is rotated to the left on today's exam, resulting in distortion of the mediastinal contours and reduced diagnostic sensitivity and specificity for mediastinal pathology. Atherosclerosis in the thoracic aorta.  IMPRESSION: 1. Findings, as above, most compatible with mild congestive heart failure.   Electronically Signed   By: Vinnie Langton M.D.   On: 06/11/2013 13:36   Dg Knee Complete 4 Views Left  05/29/2013   CLINICAL DATA:  Status post fall. Patient reportedly denies knee pain.  EXAM: LEFT KNEE - COMPLETE 4+ VIEW  COMPARISON:  10/26/2010.  FINDINGS: The bones are diffusely demineralized. There is stable moderate tricompartmental joint space loss. The lateral view demonstrates a moderate-sized joint effusion with a probable fat fluid level indicating a lipohemarthrosis. There is a mildly displaced fracture of the fibular neck. There is a possible fracture involving the medial cortex of the proximal tibia. No depressed tibial plateau fracture is identified. The distal femur appears intact. Multiple soft tissue calcifications are noted.  IMPRESSION: Acute mildly displaced fracture of the fibular neck with possible fracture of the medial tibial metaphysis. Intra-articular extension of a fracture is implied by the presence of a lipohemarthrosis. Consider CT for further evaluation.   Electronically Signed   By: Camie Patience M.D.   On: 06/09/2013 14:00    ROS SEE ABOVE   Blood pressure 130/65, pulse 82, temperature 99.6 F (37.6 C), temperature source Oral, resp. rate 24, height $RemoveBe'5\' 10"'BsHOPyeie$  (1.778 m), weight 230 lb 6.1 oz (104.5 kg), SpO2 91.00%. Physical Exam  Nursing note and vitals reviewed.  Constitutional: She is oriented to person, place, and time. She appears well-developed and well-nourished. No distress.  HENT:  Head: Normocephalic.  Eyes: Right eye exhibits no discharge. Left eye exhibits no discharge.  Neck: Neck supple. No tracheal deviation present.  Cardiovascular: Intact distal pulses.   GI: Soft. She exhibits no distension.  Musculoskeletal:       Right knee: Normal.       Left knee: She exhibits decreased range of motion, swelling, effusion, laceration and bony tenderness. She exhibits no deformity, no erythema and normal alignment. Tenderness found.  Lymphadenopathy:    She has no cervical adenopathy.   Neurological: She is alert and oriented to person, place, and time. She has normal reflexes.  Skin: Skin is warm and dry. She is not diaphoretic.  Psychiatric: She has a normal mood and affect. Her behavior is normal. Judgment and thought content normal.  SOFT COMPARTMENTS "NORMAL" UPPER EXTREMITIES, NO EVIDENCE OF CONTRACTURE , SUBLUXATION, ATROPHY OR TREMOR   Assessment/Plan: NON DISPLACED TIBIAL PLATEAU FRACTURE LEFT KNEE   6 WEEK TOE TOUCH WB F/B 6 WEEKS HINGE KNEE BRACE   XRAYS AT 14 DAYS , 42 DAYS AND 84 DAYS   Danielle Walker 05/27/2013, 7:42 AM

## 2013-05-27 NOTE — Clinical Documentation Improvement (Signed)
Please clarify respiratory status. Thank you.  Possible Clinical Conditions?  Acute Respiratory Failure Acute on Chronic Respiratory Failure Chronic Respiratory Failure Other Condition Cannot Clinically Determine   Supporting Information: Risk Factors: Daughter reports she is on 2 L of oxygen at home and has had increased to 3 L recently. CHIEF COMPLAINT: Cough, congestion, shortness of breath; History of CHF Former smoker admission for her CHF exacerbation  Signs & Symptoms: Shortness of breath breath sounds equal bilaterally, diffuse expiratory wheeze and rales at her bases  Diagnostics: Chest x-ray concerns diffuse pulmonary edema.  Treatment: O2 Therapy continuous; keep sats greater than 92%  Thank You, Danielle Walker Dea ,RN Clinical Documentation Specialist:  [phone]  [facility]

## 2013-05-27 NOTE — Progress Notes (Signed)
UR chart review completed.  

## 2013-05-27 NOTE — Clinical Social Work Psychosocial (Signed)
Clinical Social Work Department BRIEF PSYCHOSOCIAL ASSESSMENT 05/27/2013  Patient:  Danielle Walker, Danielle Walker     Account Number:  [id number]     Admit date:  05/31/2013  Clinical Social Worker:  Wyatt Haste  Date/Time:  05/27/2013 02:09 PM  Referred by:  CSW  Date Referred:  05/27/2013 Referred for  SNF Placement   Other Referral:   Interview type:  Patient Other interview type:   family    PSYCHOSOCIAL DATA Living Status:  FAMILY Admitted from facility:   Level of care:   Primary support name:  Danielle Walker Primary support relationship to patient:  CHILD, ADULT Degree of support available:   supportive    CURRENT CONCERNS Current Concerns  Post-Acute Placement   Other Concerns:    SOCIAL WORK ASSESSMENT / PLAN CSW met with pt and pt's family and caregiver at bedside. Pt alert and oriented to self and place. She does have dementia and family report that she is agreeable to everything all the time. Pt lives at home with a daughter and a son. She has a caregiver from Monday-Friday from 8:30-1:30. Her children are with her the rest of the time. Family appears to be very involved and supportive. They report at baseline, pt is able to ambulate with a walker. She has been weaker since Christmas and requiring more assist. Yesterday they were attempting to get pt in the car to go to the doctor and she slid to the ground. EMS was called and brought pt to ED. Admitted with CHF exacerbation. PT evaluated pt today and recommendation is for SNF. Pt went to SNF in 2003 after a hip fracture, but has since been home and has had home health. Pt's HCPOA/daughter Danielle Walker reports they understand pt will need additional rehab and are agreeable. She states that pt was actually very happy there last time. This appears to help them feel more comfortable with the idea. CSW discussed placement process and provided SNF list. Requested [facility] if possible. Aware of Medicare coverage/criteria.   Assessment/plan status:   Psychosocial Support/Ongoing Assessment of Needs Other assessment/ plan:   Information/referral to community resources:   SNF list    PATIENT'S/FAMILY'S RESPONSE TO PLAN OF CARE: Pt alert, but did not participate much in conversation. Was very pleasant and positive when asked how she felt about SNF. CSW will initiate bed search and follow up with bed offers when available.       Benay Pike, [place]

## 2013-05-28 DIAGNOSIS — S82209A Unspecified fracture of shaft of unspecified tibia, initial encounter for closed fracture: Secondary | ICD-10-CM

## 2013-05-28 LAB — CBC
HEMATOCRIT: 30.2 % — AB (ref 36.0–46.0)
HEMOGLOBIN: 9.1 g/dL — AB (ref 12.0–15.0)
MCH: 30.6 pg (ref 26.0–34.0)
MCHC: 30.1 g/dL (ref 30.0–36.0)
MCV: 101.7 fL — ABNORMAL HIGH (ref 78.0–100.0)
Platelets: 96 10*3/uL — ABNORMAL LOW (ref 150–400)
RBC: 2.97 MIL/uL — ABNORMAL LOW (ref 3.87–5.11)
RDW: 15.3 % (ref 11.5–15.5)
WBC: 9.8 10*3/uL (ref 4.0–10.5)

## 2013-05-28 LAB — BASIC METABOLIC PANEL
BUN: 27 mg/dL — ABNORMAL HIGH (ref 6–23)
CHLORIDE: 95 meq/L — AB (ref 96–112)
CO2: 40 meq/L — AB (ref 19–32)
Calcium: 8.8 mg/dL (ref 8.4–10.5)
Creatinine, Ser: 1.04 mg/dL (ref 0.50–1.10)
GFR calc Af Amer: 54 mL/min — ABNORMAL LOW (ref >90)
GFR calc non Af Amer: 47 mL/min — ABNORMAL LOW (ref >90)
Glucose, Bld: 122 mg/dL — ABNORMAL HIGH (ref 70–99)
POTASSIUM: 4.7 meq/L (ref 3.7–5.3)
Sodium: 143 mEq/L (ref 137–147)

## 2013-05-28 MED ORDER — FUROSEMIDE 40 MG PO TABS: 40.0000 mg | ORAL_TABLET | Freq: Two times a day (BID) | ORAL | Status: DC

## 2013-05-28 NOTE — Clinical Social Work Note (Signed)
CSW presented bed offers and pt's daughter chooses [facility]. Facility notified. Awaiting stability for d/c.   Raha Tennison, LCSW [phone] 

## 2013-05-28 NOTE — Progress Notes (Signed)
PT Cancellation Note  Patient Details Name: Danielle Walker MRN: [id number] DOB: 06/04/1923   Cancelled Treatment:     Attempted therapy x2: Patient was eating first time and with respiratory second time, will attempt again tomorrow   Lula OlszewskiEAGLETON, Dishawn Bhargava ATKINSO 05/28/2013, 4:24 PM

## 2013-05-28 NOTE — Clinical Social Work Placement (Signed)
Clinical Social Work Department CLINICAL SOCIAL WORK PLACEMENT NOTE 05/28/2013  Patient:  Chrys RacerCARTER,Stephannie V  Account Number:  [id number] Admit date:  2013/03/29  Clinical Social Worker:  Derenda FennelKARA Inge Waldroup, LCSW  Date/time:  05/27/2013 02:08 PM  Clinical Social Work is seeking post-discharge placement for this patient at the following level of care:   SKILLED NURSING   (*CSW will update this form in Epic as items are completed)   05/27/2013  Patient/family provided with [facility] Department of Clinical Social Work's list of facilities offering this level of care within the geographic area requested by the patient (or if unable, by the patient's family).  05/27/2013  Patient/family informed of their freedom to choose among providers that offer the needed level of care, that participate in Medicare, Medicaid or managed care program needed by the patient, have an available bed and are willing to accept the patient.  05/27/2013  Patient/family informed of [facility]' ownership interest in [facility], as well as of the fact that they are under no obligation to receive care at this facility.  PASARR submitted to EDS on  PASARR number received from EDS on   FL2 transmitted to all facilities in geographic area requested by pt/family on  05/27/2013 FL2 transmitted to all facilities within larger geographic area on   Patient informed that his/her managed care company has contracts with or will negotiate with  certain facilities, including the following:     Patient/family informed of bed offers received:  05/28/2013 [facility] Physician recommends and patient [facility]  Patient to be transferred to  on   Patient to be transferred to facility by   The following physician request were entered in Epic:   Additional Comments: pt has existing pasarr number.  Derenda FennelKara Ajeet Casasola, Kentucky[place]CS[place] [phone]

## 2013-05-28 NOTE — Progress Notes (Signed)
CRITICAL VALUE ALERT  Critical value received:  CO=40  Date of notification:  05/28/13  Time of notification:  0645  Critical value read back:yes  Nurse who received alert:  Jinny Sandersisha Biance Moncrief, RN  MD notified (1st page):  [phone]  Time of first page:  Rito EhrlichKrishnan  MD notified (2nd page):  Time of second page:  Responding MD:  Rito EhrlichKrishnan  Time MD responded:  0645 No new orders.

## 2013-05-28 NOTE — Progress Notes (Signed)
Attempted to wean pt down to 2L [place]. Pt was only satting 88% on 2L and on 3 L. When put back on 4L pt's sats returned to 91%. Will continue to monitor.

## 2013-05-28 NOTE — Progress Notes (Signed)
PROGRESS NOTE  Danielle Walker:[id number] DOB: [birth year] DOA: 06/12/13 PCP: Kirk Ruths, MD  Summary: 80 year old woman with history of heart failure presented with increasing shortness of breath, orthopnea, swelling of the legs abdomen and face. On weight physician's office she fell. Admitted for acute on chronic heart failure, nondisplaced fracture left leg.  Assessment/Plan: 1. Acute on chronic hypoxic respiratory failure. Secondary to CHF. Acute component appears resolved. 2. Acute diastolic CHF. Weights without significant change. We unreliable, pointing completely recorded. Clinically however the patient appears much improved. 3. Nondisplaced tibial plateau fracture left knee. Knee immobilizer. 6 week toe-touch weightbearing, followed by 6 weeks hinged knee brace. X-rays at 14 days. Followup with Dr. Romeo Apple at 14 days. 4. Chronic hypoxic, hypercarbic respiratory failure on 3 L nasal cannula. Appears to be stable. 5. Atrial fibrillation, not on warfarin secondary to falls 6. Chronic thrombocytopenia. Etiology unclear. Long-standing.can be further evaluated as an outpatient. 7. Chronic anemia. Appears to be long-standing. Continue followup in the outpatient setting. 8. Dementia. Lives with daughter at home.   Overall appears to be clinically improving.  Wean oxygen change to oral Lasix,Continue physical therapy  Code Status: DNR DVT prophylaxis: Lovenox Family Communication: Discussed in detail with daughter at bedside  Disposition Plan: SNF  Brendia Sacks, MD  [facility]  Pager [phone] If 7PM-7AM, please contact night-coverage at [email or web address], password [facility] 05/28/2013, 1:29 PM  LOS: 2 days   Consultants:  Orthopedics  Physical therapy: Skilled nursing facility  Procedures:  2-D echocardiogram: Left ventricular ejection fraction 60-65%, indeterminate diastolic function although pressure elevated suggesting increased left atrial pressure. Severe  left atrial enlargement. Mild RV dilatation. Moderate pulmonic regurgitation. Compared to previous study 09/2010, there is moderate dilatation of the left atrium, moderate to severe dilatation of the right atrium.  Antibiotics:    HPI/Subjective: Doing well. Eating well. No complaints. Daughter at bedside, patient's history is unreliable secondary to dementia. However her daughter reports the patient is doing well. No concerns.  Objective: Filed Vitals:   05/27/13 1442 05/27/13 2006 05/28/13 0433 05/28/13 0959  BP: 109/63 114/74 97/57 108/52  Pulse: 88 77 82   Temp: 99.2 F (37.3 C) 99.1 F (37.3 C) 98.8 F (37.1 C)   TempSrc: Oral Oral Oral   Resp: 22 22 21    Height:      Weight:   105.6 kg (232 lb 12.9 oz)   SpO2: 93% 93% 94%     Intake/Output Summary (Last 24 hours) at 05/28/13 1329 Last data filed at 05/28/13 0800  Gross per 24 hour  Intake    240 ml  Output    475 ml  Net   -235 ml     Filed Weights   12-Jun-2013 1832 05/27/13 0519 05/28/13 0433  Weight: 109.8 kg (242 lb 1 oz) 104.5 kg (230 lb 6.1 oz) 105.6 kg (232 lb 12.9 oz)    Exam:   Afebrile, vital signs stable. 94% on 4 L.  Gen. Appears calm and comfortable. Speech fluent and clear.  Cardiovascular regular rate and rhythm. No murmur, rub or gallop.  Respiratory clear to auscultation bilaterally. No wheezes, rales or rhonchi. Normal respiratory effort.3  Abdomen soft nontender nondistended.  Data Reviewed:  Weight without significant change  Urine output incompletely recorded and unreliable secondary to incontinence  Basic metabolic panel unremarkable except for chronic hypercarbia  Chronic thrombocytopenia noted to 01/2007. Chronic normocytic anemia. Appears to be some overall stable.  Scheduled Meds: . aspirin EC  81 mg Oral Daily  . clotrimazole  Topical BID  . donepezil  10 mg Oral QHS  . enoxaparin (LOVENOX) injection  40 mg Subcutaneous Q24H  . famotidine  10 mg Oral Daily  . folic acid   1 mg Oral Daily  . furosemide  40 mg Intravenous BID  . lisinopril  10 mg Oral Daily  . loratadine  10 mg Oral Daily  . memantine  10 mg Oral BID  . potassium chloride SA  20 mEq Oral BID  . raloxifene  60 mg Oral Daily  . sodium chloride  3 mL Intravenous Q12H  . traZODone  50 mg Oral QHS   Continuous Infusions:   Principal Problem:   Acute on chronic diastolic CHF (congestive heart failure) Active Problems:   Generalized abdominal swelling   Chronic atrial fibrillation   Diastolic CHF, chronic   HTN (hypertension), benign   Fall   Fibula fracture   Dementia   Tibial plateau fracture   Acute-on-chronic respiratory failure   Time spent 25 minutes

## 2013-05-29 ENCOUNTER — Inpatient Hospital Stay (HOSPITAL_COMMUNITY): Payer: Medicare Other

## 2013-05-29 DIAGNOSIS — J9600 Acute respiratory failure, unspecified whether with hypoxia or hypercapnia: Secondary | ICD-10-CM

## 2013-05-29 LAB — BASIC METABOLIC PANEL
BUN: 42 mg/dL — AB (ref 6–23)
CHLORIDE: 92 meq/L — AB (ref 96–112)
CO2: 40 meq/L — AB (ref 19–32)
Calcium: 8.8 mg/dL (ref 8.4–10.5)
Creatinine, Ser: 1.9 mg/dL — ABNORMAL HIGH (ref 0.50–1.10)
GFR calc non Af Amer: 23 mL/min — ABNORMAL LOW (ref >90)
GFR, EST AFRICAN AMERICAN: 26 mL/min — AB (ref >90)
GLUCOSE: 141 mg/dL — AB (ref 70–99)
POTASSIUM: 5.3 meq/L (ref 3.7–5.3)
Sodium: 140 mEq/L (ref 137–147)

## 2013-05-29 LAB — BLOOD GAS, ARTERIAL
Acid-Base Excess: 7.6 mmol/L — ABNORMAL HIGH (ref 0.0–2.0)
BICARBONATE: 34.4 meq/L — AB (ref 20.0–24.0)
Drawn by: 21310
FIO2: 100 %
O2 Saturation: 94.1 %
PCO2 ART: 77.7 mmHg — AB (ref 35.0–45.0)
Patient temperature: 37
TCO2: 31.5 mmol/L (ref 0–100)
pH, Arterial: 7.268 — ABNORMAL LOW (ref 7.350–7.450)
pO2, Arterial: 81.7 mmHg (ref 80.0–100.0)

## 2013-05-29 LAB — URINE MICROSCOPIC-ADD ON

## 2013-05-29 LAB — URINALYSIS, ROUTINE W REFLEX MICROSCOPIC
Glucose, UA: NEGATIVE mg/dL
Ketones, ur: NEGATIVE mg/dL
NITRITE: NEGATIVE
Protein, ur: 30 mg/dL — AB
Specific Gravity, Urine: >1.03 — ABNORMAL HIGH (ref 1.005–1.030)
Urobilinogen, UA: 1 mg/dL (ref 0.0–1.0)
pH: 5 (ref 5.0–8.0)

## 2013-05-29 MED ORDER — SODIUM CHLORIDE 0.9 % IV SOLN
INTRAVENOUS | Status: DC
  Administered 2013-05-29: 16:00:00 via INTRAVENOUS

## 2013-05-29 MED ORDER — SODIUM CHLORIDE 0.9 % IV BOLUS (SEPSIS)
250.0000 mL | Freq: Once | INTRAVENOUS | Status: AC
  Administered 2013-05-29: 250 mL via INTRAVENOUS

## 2013-05-29 MED ORDER — CIPROFLOXACIN IN D5W 400 MG/200ML IV SOLN
400.0000 mg | INTRAVENOUS | Status: DC
  Administered 2013-05-29: 400 mg via INTRAVENOUS
  Filled 2013-05-29: qty 200

## 2013-05-29 MED ORDER — ENOXAPARIN SODIUM 30 MG/0.3ML ~~LOC~~ SOLN
30.0000 mg | SUBCUTANEOUS | Status: DC
  Administered 2013-05-29: 30 mg via SUBCUTANEOUS
  Filled 2013-05-29: qty 0.3

## 2013-05-29 MED ORDER — ALBUTEROL SULFATE (2.5 MG/3ML) 0.083% IN NEBU: 2.5000 mg | INHALATION_SOLUTION | Freq: Four times a day (QID) | RESPIRATORY_TRACT | Status: DC

## 2013-05-29 NOTE — Progress Notes (Signed)
CRITICAL VALUE ALERT  Critical value received:  CO2 40  Date of notification:  05/29/13  Time of notification:  0633  Critical value read back:yes  Nurse who received alert:  Ellery Plunkhama Treshaun Carrico  MD notified (1st page): yes  Time of first page:  [phone]  MD notified (2nd page):  Time of second page:  Responding MD:   Time MD responded:

## 2013-05-29 NOTE — Progress Notes (Addendum)
Called for hypoxia and low BP.  At bedside, patient noted to be awake and alert. Denied any pain. Was noted to be short of breath.   Afebrile, HR in 70's, BP was 90's/60's. Sats were 80's on O2 by [place]. Was changed to NRB and came upto 91%.  Lungs reveal decreased air entry with crackles. No wheezing. Alert and cooperative but somewhat confused presumably due to dementia. Tachypneic.  Chart reviewed. Patient was given Lasix for CHF. Today her creatinine was noted to be elevated. Lasix was stopped and she was given fluids for low BP.  She is on DVT prophylaxis.  Will get CXR and ABG and decide further management.  Danielle Walker 05/29/2013 10:08 PM  CXR films reviewed. Await final report but appears to have almost complete whiteout of left though she is slightly rotated. ABG shows respiratory acidosis. Discussed above with daughter, [name]. Have apprised her of patient serious condition and possibility of further deterioration overnight. She understands and does not want aggressive measures. She wants her to be comfortable mainly. She is ok with initiation of Bipap but no intubation or CPR etc. Discussed with RN. Will start Bipap. Will transfer to stepdown if necessary from nursing standpoint.  Total critical care time: 30 mins  [name] 11:12 PM

## 2013-05-29 NOTE — Progress Notes (Signed)
ANTIBIOTIC CONSULT NOTE - INITIAL  Pharmacy Consult for Cipro Indication: UTI  Allergies  Allergen Reactions  . Penicillins Hives  . Sulfa Antibiotics Itching    Patient Measurements: Height: 5\' 10"  (177.8 cm) Weight: 233 lb 14.5 oz (106.1 kg) IBW/kg (Calculated) : 68.5 Adjusted Body Weight:   Vital Signs: Temp: 98.9 F (37.2 C) (03/05 1456) Temp src: Oral (03/05 1456) BP: 91/62 mmHg (03/05 1500) Pulse Rate: 74 (03/05 1456) Intake/Output from previous day: 03/04 0701 - 03/05 0700 In: 480 [P.O.:480] Out: 750 [Urine:750] Intake/Output from this shift: Total I/O In: 480 [P.O.:480] Out: -   Labs:  Recent Labs  05/27/13 0509 05/28/13 0508 05/29/13 0503  WBC 9.8 9.8  --   HGB 9.4* 9.1*  --   PLT 88* 96*  --   CREATININE 0.85 1.04 1.90*   Estimated Creatinine Clearance: 27.5 ml/min (by C-G formula based on Cr of 1.9). No results found for this basename: VANCOTROUGH, VANCOPEAK, VANCORANDOM, GENTTROUGH, GENTPEAK, GENTRANDOM, TOBRATROUGH, TOBRAPEAK, TOBRARND, AMIKACINPEAK, AMIKACINTROU, AMIKACIN,  in the last 72 hours   Microbiology: No results found for this or any previous visit (from the past 720 hour(s)).  Medical History: Past Medical History  Diagnosis Date  . Arrhythmia   . CHF (congestive heart failure)   . Dementia   . Osteoporosis   . Hypertension   . GERD (gastroesophageal reflux disease)   . Anemia   . Blood transfusion     Medications:  Scheduled:  . aspirin EC  81 mg Oral Daily  . ciprofloxacin  400 mg Intravenous Q24H  . clotrimazole   Topical BID  . donepezil  10 mg Oral QHS  . enoxaparin (LOVENOX) injection  30 mg Subcutaneous Q24H  . famotidine  10 mg Oral Daily  . folic acid  1 mg Oral Daily  . loratadine  10 mg Oral Daily  . memantine  10 mg Oral BID  . raloxifene  60 mg Oral Daily  . sodium chloride  3 mL Intravenous Q12H  . traZODone  50 mg Oral QHS   Assessment: Febrile last evening Hives with PCN Reduced renal  function  Goal of Therapy:  Eradicate infection  Plan:  Cipro 400 mg IV every 24 hours Monitor renal function F/U change to po when appropriate Labs per protocol  Raquel JamesPittman, Danyal Whitenack Bennett 05/29/2013,4:35 PM

## 2013-05-29 NOTE — Progress Notes (Signed)
Discussed patients continued low BP and now low urine output with Dr. Irene LimboGoodrich. Will give fluid bolus and continue to monitor.

## 2013-05-29 NOTE — Progress Notes (Signed)
Dr. Earney MalletKrishnon to floor. Ordered chest xray and to discontinue IV fluids at this time. He states he will call daughter with results of chest xray if needed.

## 2013-05-29 NOTE — Progress Notes (Signed)
PT Cancellation Note  Patient Details Name: Danielle Walker MRN: [id number] DOB: 11-25-1923   Cancelled Treatment:    Reason Eval/Treat Not Completed: Fatigue/lethargy limiting ability to participate Pt asleep in bed. Family at bedside. Attempted for 115' to awaken pt to participate in therapy. Pt would only open her eyes for a few seconds at a time. Unable to have successful treatment secondary to lethargy. Will try again tomorrow.    Seth Bakeebekah Domanick Cuccia, PTA  05/29/2013, 2:52 PM

## 2013-05-29 NOTE — Progress Notes (Signed)
Pt placed on BIPAP 12/6 with a 10L bleed in.  Pt tolerating well at this time.  O2 sat 91%.  RT will continue to monitor.

## 2013-05-29 NOTE — Progress Notes (Signed)
Mid level contacted and informed of pt's respiratory status, low BP and poor urinary output. She states she will get Dr. Earney MalletKrishnon to come see patient. Will continue to monitor.

## 2013-05-29 NOTE — Progress Notes (Signed)
Pt has critical PCO2 of 77.7. Dr. Earney MalletKrishnon informed. MD states that he has spoken to the daughter and she only wants to do bipap and just wants her to be comfortable. Pt will stay on the floor with bipap. Pt appears comfortable at this time. Will continue to monitor.

## 2013-05-29 NOTE — Progress Notes (Signed)
PROGRESS NOTE  Chrys RacerBlanche V Manheim ZOX:[id number] DOB: Aug 25, 1923 DOA: 06/13/2013 PCP: Kirk RuthsMCGOUGH,[name], MD  Summary: 80 year old woman with history of heart failure presented with increasing shortness of breath, orthopnea, swelling of the legs abdomen and face. On way to physician's office she fell. Admitted for acute on chronic heart failure, nondisplaced fracture left leg.  Assessment/Plan: 1. Acute on chronic hypoxic respiratory failure. Secondary to CHF. Remains on 4 L with inability to wean to usual 2-3. Respiratory status does appear to be stable now. 2. Acute diastolic CHF. Resolved. Actually appears dry at this point. Creatinine significantly increased following diuresis. 3. Fever last night. This went unreported. None today. 4. Nondisplaced tibial plateau fracture left knee. Knee immobilizer. 6 week toe-touch weightbearing, followed by 6 weeks hinged knee brace. X-rays at 14 days. Followup with Dr. Romeo AppleHarrison at 14 days. 5. Chronic hypoxic, hypercarbic respiratory failure on 3 L nasal cannula.  6. Atrial fibrillation, not on warfarin secondary to falls. Rate controlled. 7. Chronic thrombocytopenia. Etiology unclear. Long-standing.can be further evaluated as an outpatient. 8. Chronic anemia. Appears to be long-standing. Continue followup in the outpatient setting. 9. Dementia. Lives with daughter at home.   She appears unchanged on exam although she was febrile last night and has evidence of acute renal failure and volume depletion with borderline hypotension. Plan check urinalysis now; chest x-ray if recurs (may have been atelectasis), blood cultures if has recurrent fever.  CBC, BMP in the morning  Gentle fluids for dehydration, overdiuresis. Discontinue ACE inhibitor for now.  Wean oxygen as tolerated  Transfer to skilled nursing facility when stable  Code Status: DNR DVT prophylaxis: Lovenox Family Communication: Discussed in detail with daughter at bedside  Disposition Plan:  SNF  Brendia Sacksaniel Kohan Azizi, MD  [facility]  Pager [phone] If 7PM-7AM, please contact night-coverage at [email or web address], password [email or web address] 05/29/2013, 11:21 AM  LOS: 3 days   Consultants:  Orthopedics  Physical therapy: Skilled nursing facility  Procedures:  2-D echocardiogram: Left ventricular ejection fraction 60-65%, indeterminate diastolic function although pressure elevated suggesting increased left atrial pressure. Severe left atrial enlargement. Mild RV dilatation. Moderate pulmonic regurgitation. Compared to previous study 09/2010, there is moderate dilatation of the left atrium, moderate to severe dilatation of the right atrium.  Antibiotics:    HPI/Subjective: Febrile overnight. It does not appear that anyone was notified. The patient has no complaints, no pain, no shortness of breath. However her history is unreliable secondary to dementia. Remains on 4 L with inability to wean down to usual 2-3.  Objective: Filed Vitals:   05/28/13 2232 05/29/13 0500 05/29/13 0608 05/29/13 0742  BP: 116/54  108/52   Pulse: 92  109   Temp: 101.4 F (38.6 C)  99.5 F (37.5 C)   TempSrc: Axillary  Oral   Resp: 20  20   Height:      Weight:  106.1 kg (233 lb 14.5 oz)    SpO2: 93%  92% 93%    Intake/Output Summary (Last 24 hours) at 05/29/13 1121 Last data filed at 05/29/13 0800  Gross per 24 hour  Intake    480 ml  Output    750 ml  Net   -270 ml     Filed Weights   05/27/13 0519 05/28/13 0433 05/29/13 0500  Weight: 104.5 kg (230 lb 6.1 oz) 105.6 kg (232 lb 12.9 oz) 106.1 kg (233 lb 14.5 oz)    Exam:   Afebrile, vital signs stable. Hypoxia stable on 4 L.  Gen. Sitting up in chair with  newspaper. Appears calm and comfortable.  Cardiovascular irregular. No murmur, rub or gallop. Trace bilateral lower extremity edema.  Telemetry atrial fibrillation.  Respiratory clear to auscultation bilaterally. No wheezes, rales or rhonchi. Normal respiratory effort.  Psychiatric  pleasant, confused.  Data Reviewed:  Weight variable this hospitalization. Urine output and completely recorded.. BUN has increased, 42. Creatinine from 1.04 >> 1.90  Scheduled Meds: . aspirin EC  81 mg Oral Daily  . clotrimazole   Topical BID  . donepezil  10 mg Oral QHS  . enoxaparin (LOVENOX) injection  40 mg Subcutaneous Q24H  . famotidine  10 mg Oral Daily  . folic acid  1 mg Oral Daily  . lisinopril  10 mg Oral Daily  . loratadine  10 mg Oral Daily  . memantine  10 mg Oral BID  . potassium chloride SA  20 mEq Oral BID  . raloxifene  60 mg Oral Daily  . sodium chloride  3 mL Intravenous Q12H  . traZODone  50 mg Oral QHS   Continuous Infusions:   Principal Problem:   Acute on chronic diastolic CHF (congestive heart failure) Active Problems:   Generalized abdominal swelling   Chronic atrial fibrillation   Diastolic CHF, chronic   HTN (hypertension), benign   Fall   Fibula fracture   Dementia   Tibial plateau fracture   Acute-on-chronic respiratory failure   Time spent 25 minutes

## 2013-05-29 NOTE — Progress Notes (Signed)
PT Cancellation Note  Patient Details Name: Danielle Walker MRN: [id number] DOB: 09/04/1923   Cancelled Treatment:    Reason Eval/Treat Not Completed: Medical issues which prohibited therapy When I arrived to see pt for tx, she was found up in a recliner sleeping.  She had apparently pulled off her O2 cannula and her O2 sat was 60%.  I replaced her nasal cannula and tried to arouse her and encourage her to breathe deeply.  She was only able to arouse for a few seconds at a time.  It took about 10 min for O2 sat to return to the high 80s.  RN was alerted to this event.  I attempted to work with pt for increased mobility but she was unable to stay awake to work with me.  We will try again later today.  Myrlene BrokerBrown, Naliya Gish L 05/29/2013, 11:37 AM

## 2013-05-29 NOTE — Progress Notes (Signed)
RN reported SBP low 90s. Poor appetite. Spent most of day sleeping. Did not work with PT and was actually hypoxic earlier off of oxygen. Family noticed some redness to ankle and requested x-ray  Patient reexamined.  S She denies complaints (although history is unreliable).  O SBP 90s. 90s O2 sat on 4 L. Appears calm, comfortable, non-toxic. Alerts immediately to voice and follows commands, interacts appropriately. Normal respiratory effort.  Left ankle somewhat erythematous, non-tender. 1+ DP pulse. Moves to command and sensation grossly intact. Erythema extends to mid-tibia. Knee appears unremarkable. Has evidence of chronic venous stasis changes bilaterally.  Ankle films reviewed with Dr. Romeo AppleHarrison who see and make further recommendations, for now, NWB.  U/A positive.  A/P Plan IVF for borderline BP. Non-toxic. History and labs this am suggest overdiuresis/dehydration + BP meds. Treat for UTI and follow skin, not clearly cellulitis, no wounds, but will monitor closely.  Discussed with HCPOA/daughter by telephone.  Brendia Sacksaniel Yolandra Habig, MD [facility] [phone]

## 2013-05-29 NOTE — Progress Notes (Signed)
Pt's O2 sat 84% on 4L and BP 90/42. Respiratory called. Pt placed on non-rebreather and stating 91% and a manual BP taken of 90/62. Pt states she feels fine and is not in any pain. She tolerated medications well but is lethargic.

## 2013-05-30 ENCOUNTER — Encounter (HOSPITAL_COMMUNITY): Admission: EM | Disposition: E | Payer: Self-pay | Source: Home / Self Care | Attending: Family Medicine

## 2013-05-30 DIAGNOSIS — J9819 Other pulmonary collapse: Secondary | ICD-10-CM

## 2013-05-30 DIAGNOSIS — J9811 Atelectasis: Secondary | ICD-10-CM

## 2013-05-30 SURGERY — APPLICATION, CAST
Anesthesia: LOCAL | Site: Ankle | Laterality: Left

## 2013-06-01 LAB — URINE CULTURE: Colony Count: >=100000

## 2013-06-03 ENCOUNTER — Encounter: Payer: Self-pay | Admitting: Adult Health

## 2013-06-25 NOTE — Progress Notes (Signed)
Contacted Pt's daughter again to see what her wishes are for her mother's body. She states she wants to come see her and that she is still trying to contact her brother. The daughter is told that this is fine and asked to please please call the unit [phone] as soon as she finalizes the family's wishes.

## 2013-06-25 NOTE — Progress Notes (Signed)
Spoke with ME, Tamala Bariim McNeal concerning releasing pt's body. He states since she had a left femur fracture that he needs to come and do a report later today. Body will be sent to morgue once family calls or comes to hospital.

## 2013-06-25 NOTE — Care Management Note (Addendum)
    Page 1 of 1   06/15/2013     11:37:45 AM   CARE MANAGEMENT NOTE 06/02/2013  Patient:  Danielle Walker,Danielle V   Account Number:  [id number]  Date Initiated:  05/27/2013  Documentation initiated by:  Sharrie RothmanBLACKWELL,Evadean Sproule C  Subjective/Objective Assessment:   Pt admitted from home with CHF and tib/fib fracture. Pt lives with her son and daughter and has a Youth workerCNA Monday-Saturday for 5 hours. Pt has walker, home O2 with [facility], and ramp for home use.     Action/Plan:   PT recommends SNF at discharge. CSW is aware and will initiate bed search.   Anticipated DC Date:  2013-09-30   Anticipated DC Plan:  SKILLED NURSING FACILITY  In-house referral  Clinical Social Worker      DC Planning Services  CM consult      Choice offered to / List presented to:             Status of service:  Completed, signed off Medicare Important Message given?   (If response is "NO", the following Medicare IM given date fields will be blank) Date Medicare IM given:   Date Additional Medicare IM given:    Discharge Disposition:  SKILLED NURSING FACILITY  Per UR Regulation:    If discussed at Long Length of Stay Meetings, dates discussed:    Comments:  05/25/2013 1135 Arlyss Queenammy Maddison Kilner, RN BSN CM Pt expired today.  05/27/13 1110 Arlyss Queenammy Georgianne Gritz, RN BSN CM

## 2013-06-25 NOTE — Progress Notes (Signed)
UR chart review completed.  

## 2013-06-25 NOTE — Progress Notes (Signed)
Pt's daughter called. She is on her way to the hospital.

## 2013-06-25 NOTE — Progress Notes (Signed)
Pt expired. Pronounced by Ellery Plunkhama Aking Klabunde, RN and Johnsie Cancelracie Neilson,Rn. MD notified. [facility] notified. Reference number [id number]. Next of kin, Carolanne GrumblingBeth Robertson contacted. Pt bathed  and prepared for funeral home.

## 2013-06-25 NOTE — Discharge Summary (Addendum)
Death Summary  Danielle Walker WRU:[id number] DOB: 08-11-1923 DOA: 05/25/2013  PCP: Danielle RuthsMCGOUGH,Danielle M, MD  Admit date: 06/03/2013 Date of Death: 05/28/2013  Final Diagnoses:  1. Acute on chronic respiratory failure with acute respiratory acidosis secondary to left lung collapse 2. Left lung collapse secondary to suspected mucous plug 3. Suspected Mucous plug 4. Acute on chronic diastolic congestive heart failure 5. Acute renal failure 6. Chronic hypoxic, hypercarbic respiratory failure on home oxygen 7. Nondisplaced tibial plateau fracture left knee 8. Medial and lateral malleolar fractures, oblique distal fibular shaft fracture left leg 9. Chronic thrombocytopenia 10. Atrial fibrillation, not a Coumadin candidate secondary to falls 11. Chronic anemia 12. Dementia  History of present illness:  80 year old woman with history of heart failure and chronic hypercarbic/hypoxic respiratory failure on home oxygen presented with increasing shortness of breath, orthopnea, swelling of the legs abdomen and face. Recently had increased home oxygen from 2 to 3 L [place]. On way to physician's office she fell with resulting leg pain. Imaging revealed left tibial plateau fracture. Admitted for acute on chronic heart failure, nondisplaced fracture left leg and orthopedic consultation.  Daughter reported the patient was declining for the last several weeks with increasing lethargy, sleeping more and getting weaker.  Hospital Course:  On admission the patient was noted to be hypoxic, requiring 4 L of nasal cannula. Chest x-ray revealed mild CHF. Review previous chest x-rays from December with similar appearance of CHF suggesting a chronic component. Patient was treated with IV Lasix with improvement in clinical condition and although she remained hypoxic, she was not dyspneic. Patient was seen in consultation with orthopedics, left knee immobilization and ambulation with toe-touch weightbearing was  recommended.  Lasix was transitioned to oral and then discontinued when the patient developed acute renal failure with prerenal pattern suggesting overdiuresis and mild asymptomatic hypotension. ACE inhibitor was discontinued and patient was given a small amount of IV fluids. Her hypoxia and clinical condition were noted to be stable. Isolated fever was noted overnight without recurrence. Urinalysis suggested UTI and the patient was started on antibiotics.  The evening prior to her death she became acutely and profoundly hypoxic and tachypneic and chest x-ray revealed new dense opacification of the left chest, highly suspicious for collapse of the lower lobe. ABG revealed acute respiratory acidosis. In consultation with family she was placed on BiPAP but no aggressive measures beyond that were permissible. The patient rapidly declined and subsequently died early in the morning 3/6.  In review, given her kyphosis, chronic hypercarbic/hypoxic respiratory failure with increased oxygen requirement as an outpatient and low lung volumes chronically, her acute lung collapse is highly suspicious for mucous plugging. CHF pattern appeared stable on repeat CXR. History and clinical findings do not suggest VTE (on Lovenox since admission) or fat embolism (no rash, had chronic dementia without alteration in mental status, chronic thrombocytopenia and CXR findings are typically normal in majority of cases).  Discussed with daughter, brothers at bedside.  Time: 45 minutes  Signed:  Brendia Sacksaniel Jones Viviani, MD  [facility] 06/17/2013, 8:21 AM

## 2013-06-25 DEATH — deceased

## 2014-09-21 IMAGING — CR DG ANKLE PORT 2V*L*
2 series · 2 of 2 positions shown · non-contrast
Comparison: None.

CLINICAL DATA: Pain status post trauma

EXAM:
PORTABLE LEFT ANKLE - 2 VIEW

[ap]
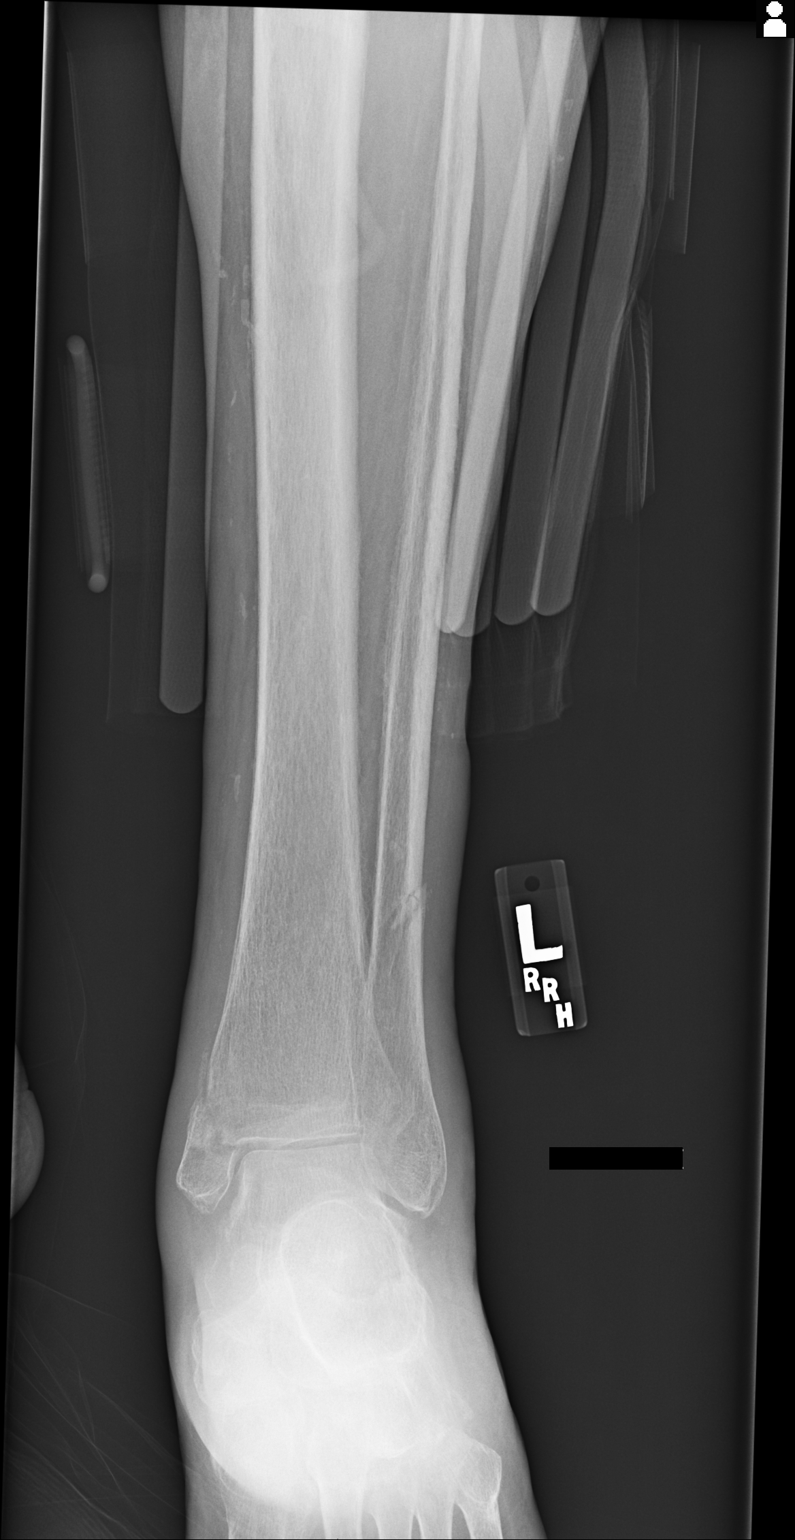

[lat]
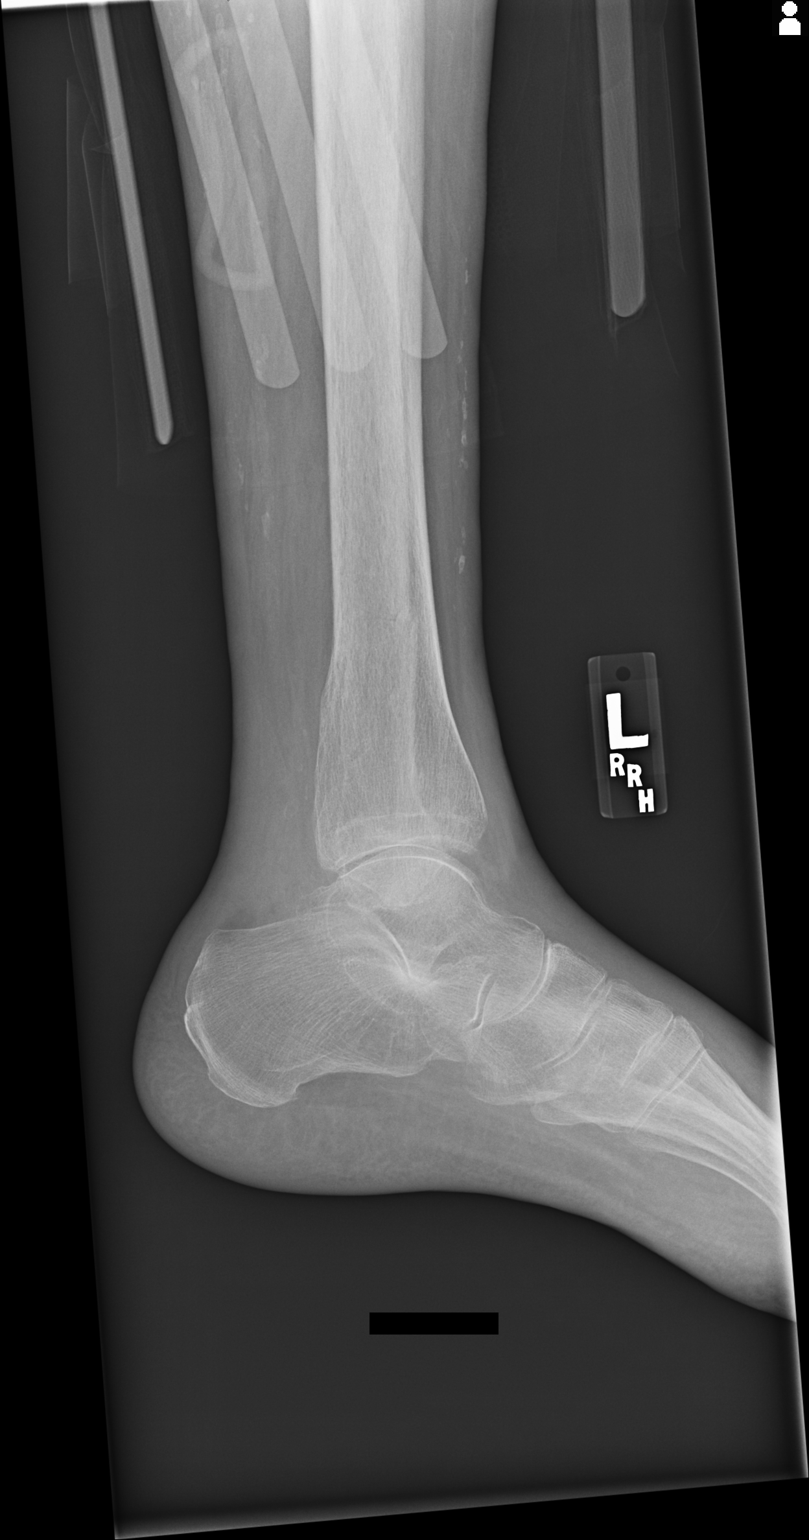

[2 of 2 positions shown; findings below may reference images not displayed]

FINDINGS: An oblique nondisplaced distal fibular shaft fracture is
appreciated. A nondisplaced lateral malleolus fracture is
appreciated. A minimally distracted medial malleolus fracture is
appreciated extending from the superior aspect of the malleolus to
the medial portion of the tibial plafond. There is intra-articular
extension. There are also findings concerning for a nondisplaced
distal posterior malleolus fracture. There is minimal widening of
the medial malleolus talar dome interval. The bones are osteopenic.
IMPRESSION: Minimally distracted medial malleolus fracture with intra-articular
extension and minimal ankle mortise destruction. There also findings
concerning for nondisplaced fractures within the lateral malleolus
and posterior malleolar regions. Oblique distal fibular shaft
fracture.

## 2016-04-25 ENCOUNTER — Encounter: Payer: Self-pay | Admitting: Internal Medicine
